# Patient Record
Sex: Female | Born: 1950
Health system: Southern US, Community
[De-identification: ages and names within clinical notes are randomized; demographics above are authoritative.]

## PROBLEM LIST (undated history)

## (undated) DIAGNOSIS — Z923 Personal history of irradiation: Secondary | ICD-10-CM

## (undated) DIAGNOSIS — I10 Essential (primary) hypertension: Secondary | ICD-10-CM

## (undated) DIAGNOSIS — D0501 Lobular carcinoma in situ of right breast: Secondary | ICD-10-CM

## (undated) DIAGNOSIS — E785 Hyperlipidemia, unspecified: Secondary | ICD-10-CM

## (undated) DIAGNOSIS — I872 Venous insufficiency (chronic) (peripheral): Secondary | ICD-10-CM

## (undated) DIAGNOSIS — E119 Type 2 diabetes mellitus without complications: Secondary | ICD-10-CM

## (undated) DIAGNOSIS — E669 Obesity, unspecified: Secondary | ICD-10-CM

## (undated) HISTORY — PX: CHOLECYSTECTOMY: SHX55

## (undated) HISTORY — DX: Hyperlipidemia, unspecified: E78.5

## (undated) HISTORY — DX: Essential (primary) hypertension: I10

## (undated) HISTORY — DX: Obesity, unspecified: E66.9

## (undated) HISTORY — PX: ABDOMINAL HYSTERECTOMY: SHX81

## (undated) HISTORY — DX: Venous insufficiency (chronic) (peripheral): I87.2

---
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
## 2001-05-16 ENCOUNTER — Emergency Department (HOSPITAL_COMMUNITY): Admission: EM | Admit: 2001-05-16 | Discharge: 2001-05-17 | Payer: Self-pay | Admitting: Emergency Medicine

## 2002-01-14 ENCOUNTER — Encounter: Payer: Self-pay | Admitting: Family Medicine

## 2002-01-14 ENCOUNTER — Ambulatory Visit (HOSPITAL_COMMUNITY): Admission: RE | Admit: 2002-01-14 | Discharge: 2002-01-14 | Payer: Self-pay | Admitting: Family Medicine

## 2002-01-16 ENCOUNTER — Ambulatory Visit (HOSPITAL_COMMUNITY): Admission: RE | Admit: 2002-01-16 | Discharge: 2002-01-16 | Payer: Self-pay | Admitting: Family Medicine

## 2002-01-17 ENCOUNTER — Encounter: Payer: Self-pay | Admitting: Family Medicine

## 2002-01-23 ENCOUNTER — Ambulatory Visit (HOSPITAL_COMMUNITY): Admission: RE | Admit: 2002-01-23 | Discharge: 2002-01-23 | Payer: Self-pay | Admitting: Family Medicine

## 2002-01-23 ENCOUNTER — Encounter: Payer: Self-pay | Admitting: Family Medicine

## 2002-02-11 ENCOUNTER — Encounter: Payer: Self-pay | Admitting: Family Medicine

## 2002-02-11 ENCOUNTER — Ambulatory Visit (HOSPITAL_COMMUNITY): Admission: RE | Admit: 2002-02-11 | Discharge: 2002-02-11 | Payer: Self-pay | Admitting: Family Medicine

## 2003-01-19 ENCOUNTER — Ambulatory Visit (HOSPITAL_COMMUNITY): Admission: RE | Admit: 2003-01-19 | Discharge: 2003-01-19 | Payer: Self-pay | Admitting: Family Medicine

## 2003-01-19 ENCOUNTER — Encounter: Payer: Self-pay | Admitting: Family Medicine

## 2003-03-31 ENCOUNTER — Ambulatory Visit (HOSPITAL_COMMUNITY): Admission: RE | Admit: 2003-03-31 | Discharge: 2003-03-31 | Payer: Self-pay | Admitting: Family Medicine

## 2003-03-31 ENCOUNTER — Encounter: Payer: Self-pay | Admitting: Family Medicine

## 2003-10-01 ENCOUNTER — Ambulatory Visit (HOSPITAL_COMMUNITY): Admission: RE | Admit: 2003-10-01 | Discharge: 2003-10-01 | Payer: Self-pay | Admitting: Family Medicine

## 2003-11-03 ENCOUNTER — Encounter (HOSPITAL_COMMUNITY): Admission: RE | Admit: 2003-11-03 | Discharge: 2003-11-04 | Payer: Self-pay | Admitting: Family Medicine

## 2003-11-27 ENCOUNTER — Encounter (HOSPITAL_COMMUNITY): Admission: RE | Admit: 2003-11-27 | Discharge: 2003-12-27 | Payer: Self-pay | Admitting: Family Medicine

## 2004-03-04 ENCOUNTER — Inpatient Hospital Stay (HOSPITAL_COMMUNITY): Admission: AD | Admit: 2004-03-04 | Discharge: 2004-03-10 | Payer: Self-pay | Admitting: Family Medicine

## 2004-06-30 ENCOUNTER — Ambulatory Visit (HOSPITAL_COMMUNITY): Admission: RE | Admit: 2004-06-30 | Discharge: 2004-06-30 | Payer: Self-pay | Admitting: Family Medicine

## 2004-07-26 ENCOUNTER — Ambulatory Visit (HOSPITAL_COMMUNITY): Payer: Self-pay | Admitting: Oncology

## 2004-07-26 ENCOUNTER — Encounter (HOSPITAL_COMMUNITY): Admission: RE | Admit: 2004-07-26 | Discharge: 2004-08-19 | Payer: Self-pay | Admitting: Oncology

## 2004-07-26 ENCOUNTER — Encounter: Admission: RE | Admit: 2004-07-26 | Discharge: 2004-08-19 | Payer: Self-pay | Admitting: Oncology

## 2004-08-19 ENCOUNTER — Ambulatory Visit (HOSPITAL_COMMUNITY): Admission: RE | Admit: 2004-08-19 | Discharge: 2004-08-19 | Payer: Self-pay | Admitting: General Surgery

## 2005-05-30 ENCOUNTER — Ambulatory Visit: Payer: Self-pay | Admitting: *Deleted

## 2005-06-07 ENCOUNTER — Encounter (HOSPITAL_COMMUNITY): Admission: RE | Admit: 2005-06-07 | Discharge: 2005-07-07 | Payer: Self-pay | Admitting: Cardiology

## 2005-06-07 ENCOUNTER — Ambulatory Visit: Payer: Self-pay | Admitting: Cardiology

## 2005-06-22 ENCOUNTER — Ambulatory Visit (HOSPITAL_COMMUNITY): Admission: RE | Admit: 2005-06-22 | Discharge: 2005-06-22 | Payer: Self-pay | Admitting: General Surgery

## 2005-07-27 ENCOUNTER — Ambulatory Visit (HOSPITAL_COMMUNITY): Admission: RE | Admit: 2005-07-27 | Discharge: 2005-07-27 | Payer: Self-pay | Admitting: Family Medicine

## 2007-03-08 ENCOUNTER — Ambulatory Visit (HOSPITAL_COMMUNITY): Admission: RE | Admit: 2007-03-08 | Discharge: 2007-03-08 | Payer: Self-pay | Admitting: Family Medicine

## 2007-03-27 ENCOUNTER — Ambulatory Visit (HOSPITAL_COMMUNITY): Admission: RE | Admit: 2007-03-27 | Discharge: 2007-03-27 | Payer: Self-pay | Admitting: Family Medicine

## 2007-06-01 ENCOUNTER — Encounter: Payer: Self-pay | Admitting: Cardiology

## 2007-07-17 ENCOUNTER — Ambulatory Visit: Payer: Self-pay | Admitting: Cardiology

## 2008-02-12 ENCOUNTER — Ambulatory Visit (HOSPITAL_COMMUNITY): Admission: RE | Admit: 2008-02-12 | Discharge: 2008-02-12 | Payer: Self-pay | Admitting: Family Medicine

## 2008-02-15 ENCOUNTER — Ambulatory Visit (HOSPITAL_COMMUNITY): Admission: RE | Admit: 2008-02-15 | Discharge: 2008-02-15 | Payer: Self-pay | Admitting: Family Medicine

## 2009-02-08 DIAGNOSIS — I10 Essential (primary) hypertension: Secondary | ICD-10-CM | POA: Insufficient documentation

## 2009-02-08 DIAGNOSIS — R609 Edema, unspecified: Secondary | ICD-10-CM

## 2009-02-08 DIAGNOSIS — E785 Hyperlipidemia, unspecified: Secondary | ICD-10-CM

## 2009-02-08 DIAGNOSIS — R079 Chest pain, unspecified: Secondary | ICD-10-CM

## 2009-02-08 DIAGNOSIS — I872 Venous insufficiency (chronic) (peripheral): Secondary | ICD-10-CM | POA: Insufficient documentation

## 2009-04-13 ENCOUNTER — Encounter: Admission: RE | Admit: 2009-04-13 | Discharge: 2009-04-13 | Payer: Self-pay | Admitting: Family Medicine

## 2010-05-31 ENCOUNTER — Ambulatory Visit (HOSPITAL_COMMUNITY): Admission: RE | Admit: 2010-05-31 | Discharge: 2010-05-31 | Payer: Self-pay | Admitting: Family Medicine

## 2010-07-13 ENCOUNTER — Other Ambulatory Visit: Admission: RE | Admit: 2010-07-13 | Discharge: 2010-07-13 | Payer: Self-pay | Admitting: Family Medicine

## 2010-08-09 ENCOUNTER — Encounter
Admission: RE | Admit: 2010-08-09 | Discharge: 2010-08-09 | Payer: Self-pay | Source: Home / Self Care | Attending: Family Medicine | Admitting: Family Medicine

## 2010-09-10 ENCOUNTER — Encounter: Payer: Self-pay | Admitting: Cardiology

## 2010-09-11 ENCOUNTER — Encounter (HOSPITAL_COMMUNITY): Payer: Self-pay | Admitting: Oncology

## 2011-01-03 NOTE — Letter (Signed)
July 17, 2007    [name]. Loleta Chance, MD  [address]  [place], [place]  [address]   RE:  LEEAN, AMEZCUA  MRN:  [id number]  /  DOB:  [birth year]   Dear Earvin Hansen:   It is my pleasure seeing Ms. Bolla again in consultation for chest  pain.  As you know, I evaluated this nice woman in 2006.  She underwent  a stress nuclear study at that time which was negative.  We tried on a  number of occasions to see her following that test, but she was unable  to keep any of those appointments.  She had done well until a few weeks  ago when she experienced 2 or 3 days of left chest discomfort.  There  was chest wall tenderness both by her report and your exam.  There was  question of some sort of heart rhythm irregularity, but this is not  documented.  She now returns for reassessment.   She has been found to have moderate hyperlipidemia and was taking a  statin for a week.  She had a subsequent cholesterol determination at  work that was good and discontinued that medication.  At the present  time, she feels perfectly well, her only other medicine is  triamterine/HCTZ 50/25 mg daily.  She has chronic pedal edema, but this  has been generally well-controlled.  She wears compression stockings  occasionally.   EXAMINATION:  Pleasant woman in no acute distress.  The weight is 260,  eight pounds less than at her last visit.  Blood pressure 125/80, heart  rate 68 and regular, respirations 18.  NECK:  No jugular venous distention; normal carotid upstrokes without  bruits.  ENDOCRINE:  No thyromegaly.  SKIN:  No significant lesions.  LUNGS:  Clear.  CARDIAC:  Normal first and second heart sounds; fourth heart sound  present.  ABDOMEN:  Soft and nontender; no organomegaly.  EXTREMITIES:  With 1+ pitting edema with some woody edema as well;  normal distal pulses.   Rhythm strip:  Normal sinus rhythm; moderately frequent monomorphic  PVCs.   IMPRESSION:  Ms. Giraldo has recurrent  atypical chest discomfort with a  single brief episode, chest wall tightness and the current absence of  symptoms, I would not perform any further testing.  She also has  asymptomatic premature ventricular contractions.  Likewise, these do not  require any specific therapy.  I explained to her that her statin, which  turns out to be lovastatin on the basis of our pill finder, is only  effective while it is actually being taken.  She will resume this  medication and return to you for further followup.  She will continue  her efforts to lose weight.  I have suggested she might want to consult  one of the vein specialists in [facility] for assessment of her chronic  pedal edema.  Please send her back to me at any time that I can offer  assistance in her care.    Sincerely,      Gerrit Friends. Dietrich Pates, MD, [facility]  Electronically Signed    RMR/MedQ  DD: 07/17/2007  DT: 07/17/2007  Job #: [id number]

## 2011-01-06 NOTE — H&P (Signed)
NAME:  [name], [name]                       ACCOUNT NO.:  [id number]   MEDICAL RECORD NO.:  [id number]                   PATIENT TYPE:  INP   LOCATION:  A322                                 FACILITY:  [facility]   PHYSICIAN:  Annia Friendly. Loleta Chance, M.D.                DATE OF BIRTH:  June 11, 1951   DATE OF ADMISSION:  03/04/2004  DATE OF DISCHARGE:                                HISTORY & PHYSICAL   The patient was a 60 year old, married, gravida 3, para 3, AB 0, employee of  [employer], black female from [place], [place].   CHIEF COMPLAINT:  Nausea, vomiting, stomach pain, watery stool less than 24  hours.   The patient ate liver with gravy and biscuit, on March 03, 2004, at a [employer] around 12:30 p.m.  The patient incurred first episode of nausea,  vomiting, and watery stools around 2200 on March 03, 2004.  The vomiting was  described as brownish color.  History is also positive for general malaise  and crampy lower abdominal pain throughout the night.  The patient vomited  on the morning of admission and she incurred four watery brown stools on the  morning of admission.  She has also vomited x 1 since admission to [facility] and experienced a small watery brown stool.  The patient  admits to experiencing sweaty spells throughout the night.  Also, she  incurred right leg pain involving the calf and right thigh, starting around  1300 on March 04, 2004.  She denied discoloration, hotness, and lesion on the  right leg.  History is also negative for trauma to the right leg.   MEDICAL HISTORY:  1. Chronic bilateral venous insufficiency.  2. Hypertension.  3. 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  Medical history is negative for diabetes, tuberculosis, cancer, sickle cell,  asthma, seizure disorder.   PRESCRIBED MEDICATIONS:  1. Aldactone 25/25 p.o. every day.  2. Flexeril  5 mg p.o. t.i.d.  3. Reprexain 5/200 one tablet p.o. q.4-6h. p.r.n. for pain.   HABITS:  Negative for ethanol, tobacco, or street drugs.   The patient is not allergic to any known medications.   Sexually transmitted disease history is negative for gonorrhea, syphilis,  herpes, and HIV infection.   PAST MEDICAL HISTORY:  Positive for hospitalizations for:  1. Cholecystectomy in 1990s at [facility].  2. Hysterectomy at [facility] secondary to fibroids.  3. Pregnancy.  4. Abdominal pain, in the 1990s at [facility], secondary to     spasmodic colon.  5. Hospitalization for cellulitis of the right leg by Dr. Theresia Majors. Tanda Rockers     at [facility] in 1998.   FAMILY HISTORY:  Revealed mother living in her 51s with a history of  hypertension, diabetes.  Father deceased at age  86 secondary to prostate  cancer.  One sister deceased in her 61s secondary to complications of  multiple sclerosis.  One sister living age 26 with a history of  hypertension.  Two brothers living, age 49 and 59, good health.  One son  living age 81 good health.  Two daughters living age 51 and 109, good health.   REVIEW OF SYSTEMS:  Positive chronic massive swelling of both legs (right  greater than left).  Review of systems negative for sore throat, epistaxis,  chronic cough, shortness of breath, syncope, dizziness, dysuria, gross  hematuria, leg ulcers, melena, constipation, hematemesis, and chest pain.   PHYSICAL EXAMINATION:  GENERAL APPEARANCE:  Revealed a middle-aged,  overweight, medium height, alert, black female who appeared not to feel  well.  VITAL SIGNS:  Temperature 103.2, pulse 111, respirations 28, blood pressure  185/80.  HEAD:  Normocephalic.  SKIN:  Hot and dry.  EARS:  Normal auricle.  External canal patent.  Tympanic membranes pearly  gray.  EYES:  Lids negative for ptosis.  Sclerae are white.  Pupils round, equally  reactive to light.  Extraocular movements  intact.  NOSE:  Negative for discharge.  MOUTH:  Positive missing teeth, remaining dentition fair.  No bleeding gums.  THROAT:  Posterior pharynx positive bilaterally enlarged tonsils without  erythema or exudate.  NECK:  Negative lymphadenopathy or thyromegaly.  Supraclavicular space with  no palpable nodes.  LUNGS:  Clear.  HEART:  Audible S1 S2 without murmur.  Rate 108 and regular.  BREASTS:  No skin changes.  Nipple erect.  ABDOMEN:  Obese.  Positive for old healed mid hyper-gastric surgical scar.  Hyperactive bowel sounds.  Soft.  Positive mild right lower quadrant and  left lower quadrant tenderness.  No palpable mass.  No organomegaly.  PELVIC:  External genitalia normal female.  Right groin no palpable nodes.  Nontender on palpation.  RECTAL:  Deferred.  EXTREMITIES:  Positive for swelling of both tibia (right greater than left)  including right tibia positive for redness of distal tibia and right foot.  Right tibia, positive diffuse tenderness on palpation.  Right thigh,  positive tenderness involving the medial aspect along medial line.  NEURO:  Alert and oriented to person, place and time.  Cranial nerves II-XII  appeared intact.   LABS:  White count 13.6, hemoglobin 12.4, hematocrit 36.1, platelets  183,000.  Prothrombin time 13.6, INR 1.1, partial thromboplastin time 27.  Sodium 127, potassium 3.4, chloride 95, CO2 24, glucose 156, BUN 13,  creatinine 0.9.   IMPRESSION:  1. Primary acute gastroenteritis.  2. Electrolyte imbalance secondary to number one.  3. Chronic venous insufficiency with her right lower extremity cellulitis.   SECONDARY DIAGNOSES:  1. Moderate left median neuropathy at the wrist.  2. Moderate right median neuropathy at the wrist.  3. Left ulnar neuropathy.  4. Hypertension.   PLAN:  1. IV fluids.  2. Blood cultures.  3. IV antibiotics Ancef, gentamicin IV x 1.  4. P.o. Levaquin.  5. Venous doppler study.  6. Lovenox subcu. 7. Elevation  of legs.  8. Pepcid 20 mg p.o. q.12h., Tylenol 650 mg p.o. q.4-6h. p.r.n. for     temperature 101 or greater, hydrocodone 5 mg p.o. q.4-6h. p.r.n. for     pain, Motrin 800 mg p.o. t.i.d. with meals,  Aldactazide 25/25 one tablet     p.o. every day, potassium supplement 20 mEq p.o. b.i.d. x 2 days.  9. A 4-gram sodium diet.  10.  Repeat MET-7 early morning x 3 and repeat CBC in 24 hours.  11.      X-ray of her abdomen.     ___________________________________________                                         Annia Friendly. Loleta Chance, M.D.   Levonne Hubert  D:  03/04/2004  T:  03/05/2004  Job:  [id number]

## 2011-01-06 NOTE — H&P (Signed)
[name], [name]              ACCOUNT NO.:  [id number]   MEDICAL RECORD NO.:  [id number]          PATIENT TYPE:  AMB   LOCATION:  DAY                           FACILITY:  [facility]   PHYSICIAN:  [name], M.D.   DATE OF BIRTH:  06/01/1951   DATE OF ADMISSION:  DATE OF DISCHARGE:  LH                                HISTORY & PHYSICAL   A 60 year old female referred for upper endoscopy.  The patient has had  episodic chest pain with mid sternal aching which has been relieved with  PPIs.  She has had recurrent fullness in her chest.  Cardiac evaluation was  negative.  Cardiology felt that her symptoms were gastroesophageal in origin  and felt that she needed to have endoscopy to rule out esophageal or gastric  disorder.   PAST HISTORY:  1.  Hypertension.  2.  Atypical chest pain.  3.  Chronic anxiety disorder.  4.  Bilateral ulnar neuropathy at the wrists.  5.  Obesity.   SURGERY:  1.  Cholecystectomy.  2.  Hysterectomy.   FAMILY HISTORY:  Positive for hypertension, diabetes, multiple sclerosis.   MEDICATIONS:  1.  Omeprazole 20 mg daily.  2.  Allegra 180 mg daily.  3.  __________  10 mg daily.  4.  Dyazide 50/12.5 daily.   PHYSICAL EXAMINATION:  GENERAL:  She is a moderately obese female in no  acute distress.  VITAL SIGNS:  Blood pressure 160/82, pulse 96, respirations 20, weight 256  pounds.  HEENT:  Unremarkable.  NECK:  Supple.  No JVD, bruit, adenopathy, or thyromegaly.  CHEST:  Clear to auscultation.  HEART:  Regular rate and rhythm without murmur, gallop, or rub.  ABDOMEN:  Mid epigastric tenderness.  No masses.  Normal bowel sounds.  EXTREMITIES:  Bilateral pitting edema, full pulses.  NEUROLOGIC:  No focal motor, sensory, or cerebellar deficit.   IMPRESSION:  1.  Recurrent atypical chest pain, symptoms of gastroesophageal reflux,      possible esophageal spasm.  2.  Hypertension.  3.  Chronic venous insufficiency.  4.  Bilateral carpal tunnel  compression.   PLAN:  Upper endoscopy.      Dirk Dress. Katrinka Romero, M.D.  Electronically Signed     LCS/MEDQ  D:  06/21/2005  T:  06/21/2005  Job:  [id number]

## 2011-01-06 NOTE — Discharge Summary (Signed)
NAME:  [name], [name]                       ACCOUNT NO.:  [id number]   MEDICAL RECORD NO.:  [id number]                   PATIENT TYPE:  INP   LOCATION:  A322                                 FACILITY:  [facility]   PHYSICIAN:  Annia Friendly. Loleta Chance, M.D.                DATE OF BIRTH:  [birth year]   DATE OF ADMISSION:  03/04/2004  DATE OF DISCHARGE:  03/10/2004                                 DISCHARGE SUMMARY   HISTORY:  The patient was a 60 year old married gravida 3, para 3, AB-0  employee of [employer].  A black female from [place], [place].  Chief complaint was nausea, vomiting, stomach pain, and watery stool less  than 24 hours.  The patient ate liver with gravy and biscuit on March 03, 2004, at a [employer] at around 12:30 p.m.  The patient incurred her  first episode of nausea, vomiting, watery stool around 2200 on March 03, 2004.  Vomitus which is described as brownish in color.  History was  positive for general malaise and crampy lower-abdominal pain throughout the  night.  The patient vomited on the morning of admission, and she incurred  four watery stools on the morning of admission.  The patient vomited x1 at  the time of admission to [facility], and incurred again a small,  watery brown stool.  The patient admitted to experiencing a sweaty spell  throughout the night.  She also incurred right leg pain involving the calf  and right thigh starting around 1300 on March 04, 2004.  She denied  discoloration, hotness and lesion on her right leg.  Moreover, history was  negative for trauma to the right leg.  History was significant for chronic  swelling of both legs (right greater than left).   PAST MEDICAL HISTORY:  Positive for chronic bilateral venous insufficiency,  hypertension, moderate left median neuropathy at the wrist and left ulnar  neuropathy.   HABITS:  Negative for tobacco, ethanol and street drugs.  The patient was  not allergic to any known  medications.   PAST MEDICAL HISTORY:  1. Positive for hospitalization with cholecystectomy in the 1990's at [facility].  2. Hysterectomy at [facility] secondary to fibroids.  3. Pregnancy.  4. Abdominal pain in 1990 at [facility] secondary to spasmodic     colon, and hospitalization for cellulitis of right leg by Dr. Theresia Majors.     Tanda Rockers at [facility] in 1998.   FAMILY HISTORY:  Mother living in her 24's with a history of hypertension  and diabetes.  Father is deceased at age 29 secondary to prostate cancer.  One sister is deceased in her 3's secondary to complications of multiple  sclerosis.  One sister is living at age 54 with a history of hypertension.  Two brothers are living at age 57 and 32 in good health.  One son living at  age 66 in good health.  Two daughters living at age 21 and 86 in good  health.   PROBLEMS:  PROBLEM #1:  Acute gastroenteritis.   PHYSICAL EXAMINATION:  General appearance is middle aged, overweight, medium  height, alert, black female who appeared not to feel well.  Vitals on  admission were as follows:  Temperature 103.2, pulse 111, respirations 28,  blood pressure 185/80.  Sclerae were white.  Lungs were clear.  Heart  revealed an audible S1 and S2 without murmur.  Rate was 108, and rhythm was  regular.  Abdomen was obese and positive for old healed hypogastric surgical  scar.  Abdominal exam demonstrated bowel sounds.  Abdomen was soft, and  positive for right lower quadrant and left lower-quadrant tenderness on  palpation.  Abdominal exam demonstrated no palpable masses or organomegaly.  The patient was neurologically intact.   LABORATORY DATA:  Significant labs on admission were as follows:  White  count 13.6, hemoglobin 12.4, hematocrit 36.1, platelets 183,000.  Sodium  127, potassium 3.4, chloride 95.  CO2 24.  Glucose 156.  BUN 13 and  creatinine 0.9.   HOSPITAL COURSE:  The patient was treated with IV  fluids using normal saline  at 50 cc per hour, Pepcid AC 20 mg p.o. q.12h.  Blood cultures x2 at 15  minutes apart, stool for ova parasites and leukocytes, stool culture.  Bedside commode, serum amylase and lipase, analgesic for pain, x-ray of the  abdomen, IV antibiotics and other supportive measures.  The patient  responded to therapy.  Repeat white count of March 06, 2003, was 11,800.  Repeat white count of March 09, 2004, was 8.6.  The patient did not incur any  significant diarrhea during this hospitalization.  She was tolerating a  regular diet without abdominal pain, nausea, vomiting or diarrhea.  The  patient was discharged to her home on March 11, 2003.  Blood culture was  negative for growth.  Urine culture was negative for growth.  The patient  was alert and oriented to person, place and time.  Erythrocyte sedimentation  rate was 32 (normal is 0 to 25).  Repeat electrolytes on March 05, 2004, were  as follows:  Sodium 138, potassium 4.1, chloride 103, CO2 29.  Serum lipase  was __________ .   PROBLEM #2:  Acute right lower-extremity cellulitis.  Examination of the  right leg demonstrated the presence of chronic swelling, but tenderness and  confluent mild redness with hotness involving the tibia diffusely.  The leg  was tender diffusely on palpation.  The patient was started on Ancef 1 gram  IV q.8h., Levaquin 750 mg p.o. daily, Motrin 800 mg p.o. t.i.d. with meals,  Flexeril 5 mg p.o. b.i.d..  The patient was also treated with bed rest,  Lovenox under the direction of pharmacy.  Prothrombin time on admission was  13.69 and INR was 1.7, and partial thromboplastin time was 27.  Venous  Doppler study on March 05, 2004, demonstrated no evidence of deep venous  thrombosis in the right lower extremity as read by Dr. Gracelyn Nurse.  The  patient experienced diffuse swelling of both lower extremities.  She still  had some mild tenderness of her right tibia at the time of discharge.   She experienced complete resolution of redness and hotness of right tibia.  She  had no tenderness of her right extremity above the knee.  She was not  complaining of shortness of breath or  chest pain.   PROBLEM #3:  Blood pressure on admission was 185/80.  Lungs were clear.  Heart exam revealed audible S1 and S2 without murmur.  Rhythm was regular,  and rate was within normal limits.  The patient was treated with sodium  restriction and Aldactazide 25/25, one tablet p.o. every day.  Blood  pressure was brought under control at the time of discharge.  Blood pressure  on the morning of discharge was 110/66 with respirations of 20.  Pulses were  87.  Lungs were clear.  Heart exam was within normal limits.   PROBLEM #4:  Obesity.  The patient was encouraged to lose weight.   PROBLEM #5.  Chronic venous insufficiency of both lower extremities.  Elevation of her feet as much as possible was mentioned to the patient.  She  was treated with Lovenox prophylactically to reduce the incidence of deep  venous thrombosis.   DISCHARGE INSTRUCTIONS:  1. Discussed at the time of discharge, diet with low salt.  2. Activity:  Bathroom privileges, feet up.   DISCHARGE MEDICATIONS:  1. Ibuprofen 800 mg, one tablet three times a day with meals.  2. Flexeril 5 mg, one tablet twice a day.  3. Aldactazide 25/ 25 one tablet p.o. every day.  4. Pepcid 20 mg, one tablet p.o. every 12 hours.  5. Keflex 500 mg, one tablet four times a day.  6. Hydrocodone APAP, 5/500, one tablet p.o. every four to six hours as     needed for pain.   FOLLOWUP:  With Dr. Loleta Chance x1 week.   FINAL PRIMARY DIAGNOSES:  1. Acute gastroenteritis.  2. Electrolyte imbalance secondary to gastroenteritis.   SECONDARY DIAGNOSES:  1. Acute right tibia cellulitis.  2. Hypertension.  3. Chronic bilateral lower-extremity venous insufficiency.  4. Moderate left median neuropathy at the left wrist.  5. Moderate right median neuropathy at  the right wrist.  6. Left ulnar neuropathy.     ___________________________________________                                         Annia Friendly. Loleta Chance, M.D.   Levonne Hubert  D:  03/10/2004  T:  03/10/2004  Job:  [id number]

## 2012-02-29 ENCOUNTER — Other Ambulatory Visit (HOSPITAL_COMMUNITY): Payer: Self-pay | Admitting: Family Medicine

## 2012-02-29 DIAGNOSIS — Z139 Encounter for screening, unspecified: Secondary | ICD-10-CM

## 2012-03-05 ENCOUNTER — Inpatient Hospital Stay (HOSPITAL_COMMUNITY): Admission: RE | Admit: 2012-03-05 | Payer: Self-pay | Source: Ambulatory Visit

## 2012-03-12 ENCOUNTER — Ambulatory Visit (HOSPITAL_COMMUNITY)
Admission: RE | Admit: 2012-03-12 | Discharge: 2012-03-12 | Disposition: A | Discharge status: Home / Self Care | Payer: Managed Care, Other (non HMO) | Source: Ambulatory Visit | Attending: Family Medicine | Admitting: Family Medicine

## 2012-03-12 DIAGNOSIS — Z139 Encounter for screening, unspecified: Secondary | ICD-10-CM

## 2012-03-12 DIAGNOSIS — Z1231 Encounter for screening mammogram for malignant neoplasm of breast: Secondary | ICD-10-CM | POA: Insufficient documentation

## 2013-01-22 ENCOUNTER — Ambulatory Visit
Admission: RE | Admit: 2013-01-22 | Discharge: 2013-01-22 | Disposition: A | Discharge status: Home / Self Care | Payer: Managed Care, Other (non HMO) | Source: Ambulatory Visit | Attending: Family Medicine | Admitting: Family Medicine

## 2013-01-22 ENCOUNTER — Other Ambulatory Visit: Payer: Self-pay | Admitting: Family Medicine

## 2013-01-22 DIAGNOSIS — R52 Pain, unspecified: Secondary | ICD-10-CM

## 2013-01-22 DIAGNOSIS — R609 Edema, unspecified: Secondary | ICD-10-CM

## 2013-02-10 ENCOUNTER — Other Ambulatory Visit: Payer: Self-pay | Admitting: *Deleted

## 2013-02-10 ENCOUNTER — Encounter: Payer: Self-pay | Admitting: Vascular Surgery

## 2013-02-10 DIAGNOSIS — I872 Venous insufficiency (chronic) (peripheral): Secondary | ICD-10-CM

## 2013-03-27 ENCOUNTER — Encounter: Payer: Self-pay | Admitting: Vascular Surgery

## 2013-03-28 ENCOUNTER — Encounter: Payer: Self-pay | Admitting: Vascular Surgery

## 2013-03-28 ENCOUNTER — Ambulatory Visit (INDEPENDENT_AMBULATORY_CARE_PROVIDER_SITE_OTHER): Payer: Managed Care, Other (non HMO) | Admitting: Vascular Surgery

## 2013-03-28 ENCOUNTER — Encounter (INDEPENDENT_AMBULATORY_CARE_PROVIDER_SITE_OTHER): Payer: Managed Care, Other (non HMO) | Admitting: *Deleted

## 2013-03-28 VITALS — BP 135/73 | HR 70 | Ht 64.5 in | Wt 253.8 lb

## 2013-03-28 DIAGNOSIS — I872 Venous insufficiency (chronic) (peripheral): Secondary | ICD-10-CM

## 2013-03-28 DIAGNOSIS — I890 Lymphedema, not elsewhere classified: Secondary | ICD-10-CM

## 2013-03-28 DIAGNOSIS — I83893 Varicose veins of bilateral lower extremities with other complications: Secondary | ICD-10-CM

## 2013-03-28 NOTE — Progress Notes (Signed)
[facility] [place]  Referred by:  [name], MD [address] [place], [place] [address]  Reason for referral: Swollen R > L leg  History of Present Illness  [name] is a 62 y.o. (Dec 30, 1950) female who presents with chief complaint: R > L swollen leg.  Patient notes, onset of swelling unknown years ago (throughout adult life), associated with sinus infections.  The patient's symptoms include: R > L leg tightness, L leg has skin changes.  Patient notes both worsen as her sinus sx worsen.  The patient has had no history of DVT, prior pregnancy, no history of varicose vein, no history of venous stasis ulcers, no known history of  Lymphedema and known history of skin changes in lower legs.  There is no family history of venous disorders.  The patient has used OTC compression stockings in the past.  Past Medical History  Diagnosis Date  . Venous insufficiency   . Stasis dermatitis   . Hypertension   . Obesity   . Hyperlipidemia   . Arthritis     Past Surgical History  Procedure Laterality Date  . Cholecystectomy      History   Social History  . Marital Status: Married    Spouse Name: N/A    Number of Children: N/A  . Years of Education: N/A   Occupational History  . Not on file.   Social History Main Topics  . Smoking status: Never Smoker   . Smokeless tobacco: Never Used  . Alcohol Use: No  . Drug Use: No  . Sexually Active: Not on file   Other Topics Concern  . Not on file   Social History Narrative  . No narrative on file    Family History  Problem Relation Age of Onset  . Diabetes Mother   . Heart disease Mother   . Hypertension Mother   . Cancer Father     Current Outpatient Prescriptions on File Prior to Visit  Medication Sig Dispense Refill  . pravastatin (PRAVACHOL) 40 MG tablet Take 40 mg by mouth daily.      . cefadroxil (DURICEF) 500 MG capsule Take 500 mg by mouth 2 (two) times daily.      . cephALEXin  (KEFLEX) 500 MG capsule Take 500 mg by mouth 4 (four) times daily.      Marland Kitchen HYDROcodone-ibuprofen (VICOPROFEN) 7.5-200 MG per tablet Take 1 tablet by mouth 2 (two) times daily.      Marland Kitchen loratadine (CLARITIN) 10 MG tablet Take 10 mg by mouth daily.      . montelukast (SINGULAIR) 10 MG tablet Take 10 mg by mouth at bedtime.      Marland Kitchen POTASSIUM CHLORIDE PO Take 10 mEq by mouth daily.      . RABEprazole (ACIPHEX) 20 MG tablet Take 20 mg by mouth daily.      Marland Kitchen spironolactone-hydrochlorothiazide (ALDACTAZIDE) 25-25 MG per tablet Take 1 tablet by mouth every 8 (eight) hours.      . traMADol (ULTRAM) 50 MG tablet Take 50 mg by mouth every 8 (eight) hours as needed for pain.       No current facility-administered medications on file prior to visit.    No Known Allergies   REVIEW OF SYSTEMS:  (Positives checked otherwise negative)  CARDIOVASCULAR:  []  chest pain, []  chest pressure, []  palpitations, []  shortness of breath when laying flat, []  shortness of breath with exertion,  [x]  pain in feet when walking, []  pain in  feet when laying flat, []  history of blood clot in veins (DVT), []  history of phlebitis, [x]  swelling in legs, []  varicose veins  PULMONARY:  []  productive cough, []  asthma, []  wheezing  NEUROLOGIC:  []  weakness in arms or legs, []  numbness in arms or legs, []  difficulty speaking or slurred speech, []  temporary loss of vision in one eye, []  dizziness  HEMATOLOGIC:  []  bleeding problems, []  problems with blood clotting too easily  MUSCULOSKEL:  []  joint pain, []  joint swelling  GASTROINTEST:  []  vomiting blood, []  blood in stool     GENITOURINARY:  []  burning with urination, []  blood in urine  PSYCHIATRIC:  []  history of major depression  INTEGUMENTARY:  []  rashes, []  ulcers  CONSTITUTIONAL:  []  fever, []  chills  Physical Examination Filed Vitals:   03/28/13 1413  BP: 135/73  Pulse: 70  Height: 5' 4.5" (1.638 m)  Weight: 253 lb 12.8 oz (115.123 kg)  SpO2: 100%   Body mass  index is 42.91 kg/(m^2).  General: A&O x 3, WDWN  Head: [place]/AT  Ear/Nose/Throat: Hearing grossly intact, nares w/o erythema or drainage, oropharynx w/o Erythema/Exudate  Eyes: PERRLA, EOMI  Neck: Supple, no nuchal rigidity, no palpable LAD  Pulmonary: Sym exp, good air movt, CTAB, no rales, rhonchi, & wheezing  Cardiac: RRR, Nl S1, S2, no Murmurs, rubs or gallops  Vascular: Vessel Right Left  Radial Palpable Palpable  Brachial  Palpable  Palpable  Carotid Palpable, without bruit Palpable, without bruit  Aorta Not palpable N/A  Femoral Palpable Palpable  Popliteal Not palpable Not palpable  PT Palpable Palpable  DP Not Palpable Not Palpable   Gastrointestinal: soft, NTND, -G/R, - HSM, - masses, - CVAT B  Musculoskeletal: M/S 5/5 throughout , Extremities without ischemic changes , B LDS, R lower leg with woody consistency, - Kaposi Stemmer sign both sides  Neurologic: Pain and light touch intact in extremities , Motor exam as listed above  Psychiatric: Judgment intact, Mood & affect appropriate for pt's clinical situation  Dermatologic: See M/S exam for extremity exam, no rashes otherwise noted  Lymph : No Cervical, Axillary, or Inguinal lymphadenopathy   Non-Invasive Vascular Imaging  BLE Venous Insufficiency Duplex (Date: 03/28/2013):   RLE: no DVT and SVT, no GSV reflux, mild CFV relux, R proximal SFV reflux, R perforator reflux  LLE: no DVT and SVT, no GSV reflux, mild CFV reflux  Outside Studies/Documentation 3 pages of outside documents were reviewed including: outpatient record.  Medical Decision Making  [name] is a 62 y.o. female who presents with: BLE chronic venous insufficiency (C2), suspect some degree of lymphedema in R leg   Based on the patient's history and examination, I recommend: compressive therapy.  I doubt any benefit to lymphoscintigraphy as the therapy is compression regardless.  I discussed with the patient the use of her 20-30  mm thigh high compression stockings.    Thank you for allowing [place] to participate in this patient's care.  Leonides Sake, MD [facility] Office: [phone] Pager: [phone]  03/28/2013, 2:43 PM

## 2013-05-06 ENCOUNTER — Other Ambulatory Visit (HOSPITAL_COMMUNITY): Payer: Self-pay | Admitting: Family Medicine

## 2013-05-06 DIAGNOSIS — Z139 Encounter for screening, unspecified: Secondary | ICD-10-CM

## 2013-05-13 ENCOUNTER — Ambulatory Visit (HOSPITAL_COMMUNITY)
Admission: RE | Admit: 2013-05-13 | Discharge: 2013-05-13 | Disposition: A | Discharge status: Home / Self Care | Payer: Managed Care, Other (non HMO) | Source: Ambulatory Visit | Attending: Family Medicine | Admitting: Family Medicine

## 2013-05-13 ENCOUNTER — Ambulatory Visit (HOSPITAL_COMMUNITY): Payer: Managed Care, Other (non HMO)

## 2013-05-13 ENCOUNTER — Other Ambulatory Visit (HOSPITAL_COMMUNITY)
Admission: RE | Admit: 2013-05-13 | Discharge: 2013-05-13 | Disposition: A | Discharge status: Home / Self Care | Payer: Managed Care, Other (non HMO) | Source: Ambulatory Visit | Attending: Family Medicine | Admitting: Family Medicine

## 2013-05-13 ENCOUNTER — Other Ambulatory Visit: Payer: Self-pay | Admitting: Family Medicine

## 2013-05-13 DIAGNOSIS — Z139 Encounter for screening, unspecified: Secondary | ICD-10-CM

## 2013-05-13 DIAGNOSIS — Z01419 Encounter for gynecological examination (general) (routine) without abnormal findings: Secondary | ICD-10-CM | POA: Insufficient documentation

## 2013-05-13 DIAGNOSIS — Z1231 Encounter for screening mammogram for malignant neoplasm of breast: Secondary | ICD-10-CM | POA: Insufficient documentation

## 2013-06-12 ENCOUNTER — Other Ambulatory Visit: Payer: Self-pay

## 2013-06-12 ENCOUNTER — Telehealth: Payer: Self-pay

## 2013-06-12 DIAGNOSIS — Z1211 Encounter for screening for malignant neoplasm of colon: Secondary | ICD-10-CM

## 2013-06-12 NOTE — Telephone Encounter (Addendum)
TAKE 1/ 2 ALDACTAZIDE ON DAY BEFORE AND DAY OF TCS.

## 2013-06-12 NOTE — Telephone Encounter (Signed)
PREPOPIK-DRINK WATER TO KEEP URINE LIGHT YELLOW.  PT SHOULD DROP OFF RX 3 DAYS PRIOR TO PROCEDURE.  

## 2013-06-12 NOTE — Telephone Encounter (Signed)
Gastroenterology Pre-Procedure Review  Request Date: 06/12/2013 Requesting Physician:Dr. [name]   PATIENT REVIEW QUESTIONS: The patient responded to the following health history questions as indicated:    1. Diabetes Melitis: no 2. Joint replacements in the past 12 months: no 3. Major health problems in the past 3 months: no 4. Has an artificial valve or MVP: no 5. Has a defibrillator: no 6. Has been advised in past to take antibiotics in advance of a procedure like teeth cleaning: no    MEDICATIONS & ALLERGIES:    Patient reports the following regarding taking any blood thinners:   Plavix? no Aspirin? YES Coumadin? no  Patient confirms/reports the following medications:  Current Outpatient Prescriptions  Medication Sig Dispense Refill  . aspirin 81 MG tablet Take 81 mg by mouth daily.      . pravastatin (PRAVACHOL) 40 MG tablet Take 40 mg by mouth daily.      Marland Kitchen spironolactone-hydrochlorothiazide (ALDACTAZIDE) 25-25 MG per tablet Take 1 tablet by mouth daily.       . traMADol (ULTRAM) 50 MG tablet Take 50 mg by mouth every 8 (eight) hours as needed for pain.       No current facility-administered medications for this visit.    Patient confirms/reports the following allergies:  No Known Allergies  No orders of the defined types were placed in this encounter.    AUTHORIZATION INFORMATION Primary Insurance:   ID #:   Group #:  Pre-Cert / Auth required Pre-Cert / Auth #:   Secondary Insurance:   ID #:   Group #:  Pre-Cert / Auth required: Pre-Cert / Auth #:   SCHEDULE INFORMATION: Procedure has been scheduled as follows:  Date: 07/02/2013           Time: 9:15 AM Location: [facility] Short Stay  This Gastroenterology Pre-Precedure Review Form is being routed to the following provider(s): Jonette Eva, MD

## 2013-06-12 NOTE — Telephone Encounter (Signed)
PT had called Darl Pikes yesterday, left message for me to return call to triage for colonoscopy. I have returned call and LMOM to call.

## 2013-06-16 MED ORDER — SOD PICOSULFATE-MAG OX-CIT ACD 10-3.5-12 MG-GM-GM PO PACK: 1.0000 | PACK | Freq: Once | ORAL | Status: DC

## 2013-06-16 NOTE — Telephone Encounter (Signed)
Rx sent to the pharmacy and instructions faxed to pt per her request to her work number/ [phone].

## 2013-06-16 NOTE — Addendum Note (Signed)
Addended by: Lavena Bullion on: 06/16/2013 11:54 AM   Modules accepted: Orders

## 2013-06-19 ENCOUNTER — Telehealth: Payer: Self-pay

## 2013-06-19 NOTE — Telephone Encounter (Signed)
I called [employer] at [phone] and spoke to [facility] who said that PA is not required for screening colonoscopy.

## 2013-07-02 ENCOUNTER — Encounter (HOSPITAL_COMMUNITY)
Admission: RE | Disposition: A | Discharge status: Home / Self Care | Payer: Self-pay | Source: Ambulatory Visit | Attending: Gastroenterology

## 2013-07-02 ENCOUNTER — Encounter (HOSPITAL_COMMUNITY): Payer: Self-pay | Admitting: *Deleted

## 2013-07-02 ENCOUNTER — Ambulatory Visit (HOSPITAL_COMMUNITY)
Admission: RE | Admit: 2013-07-02 | Discharge: 2013-07-02 | Disposition: A | Discharge status: Home / Self Care | Payer: Managed Care, Other (non HMO) | Source: Ambulatory Visit | Attending: Gastroenterology | Admitting: Gastroenterology

## 2013-07-02 DIAGNOSIS — Z1211 Encounter for screening for malignant neoplasm of colon: Secondary | ICD-10-CM | POA: Insufficient documentation

## 2013-07-02 DIAGNOSIS — D126 Benign neoplasm of colon, unspecified: Secondary | ICD-10-CM | POA: Insufficient documentation

## 2013-07-02 DIAGNOSIS — I10 Essential (primary) hypertension: Secondary | ICD-10-CM | POA: Insufficient documentation

## 2013-07-02 HISTORY — PX: COLONOSCOPY: SHX5424

## 2013-07-02 SURGERY — COLONOSCOPY
Anesthesia: Moderate Sedation

## 2013-07-02 MED ORDER — MIDAZOLAM HCL 5 MG/5ML IJ SOLN
INTRAMUSCULAR | Status: DC | PRN
  Administered 2013-07-02 (×2): 2 mg via INTRAVENOUS

## 2013-07-02 MED ORDER — MEPERIDINE HCL 100 MG/ML IJ SOLN
INTRAMUSCULAR | Status: DC | PRN
  Administered 2013-07-02 (×2): 25 mg via INTRAVENOUS

## 2013-07-02 MED ORDER — MIDAZOLAM HCL 5 MG/5ML IJ SOLN
INTRAMUSCULAR | Status: AC
  Filled 2013-07-02: qty 10

## 2013-07-02 MED ORDER — MEPERIDINE HCL 100 MG/ML IJ SOLN
INTRAMUSCULAR | Status: AC
  Filled 2013-07-02: qty 2

## 2013-07-02 MED ORDER — SODIUM CHLORIDE 0.9 % IV SOLN
INTRAVENOUS | Status: DC
  Administered 2013-07-02: 09:00:00 via INTRAVENOUS

## 2013-07-02 NOTE — Op Note (Signed)
[facility] [address] [place], [address]   COLONOSCOPY PROCEDURE REPORT  PATIENT: Sara, Romero  MR#: [id number] BIRTHDATE: [birth year] , 62  yrs. old GENDER: Female ENDOSCOPIST: Jonette Eva, MD REFERRED XB:JYNWGN Hill, M.D. PROCEDURE DATE:  07/02/2013 PROCEDURE:   Colonoscopy with cold biopsy polypectomy INDICATIONS:Average risk patient for colon cancer. MEDICATIONS: Demerol 50 mg IV and Versed 4 mg IV  DESCRIPTION OF PROCEDURE:    Physical exam was performed.  Informed consent was obtained from the patient after explaining the benefits, risks, and alternatives to procedure.  The patient was connected to monitor and placed in left lateral position. Continuous oxygen was provided by nasal cannula and IV medicine administered through an indwelling cannula.  After administration of sedation and rectal exam, the patients rectum was intubated and the EC-[id number] ([id number])  colonoscope was advanced under direct visualization to the cecum.  The scope was removed slowly by carefully examining the color, texture, anatomy, and integrity mucosa on the way out.  The patient was recovered in endoscopy and discharged home in satisfactory condition.    COLON FINDINGS: Three sessile polyps measuring 2-4 mm in size were found in the ascending colon.  A polypectomy was performed with cold forceps and The colon IS redundant.  Manual abdominal counter-pressure was used to reach the cecum.  The patient was moved on to their back to reach the cecum.  PREP QUALITY: excellent.   CECAL W/D TIME: 13 minutes COMPLICATIONS: None  ENDOSCOPIC IMPRESSION: 1.   3 COLON POLYPS REMOVED 2.   The colon IS redundant  RECOMMENDATIONS: FOLLOW A HIGH FIBER DIET.  AVOID ITEMS THAT CAUSE BLOATING. BIOPSY RESULTS SHOULD BE BACK IN 7 DAYS. Next colonoscopy in 10 years CONSIDER OVERTUBE.       _______________________________ eSignedJonette Eva, MD 07/02/2013 1:15  PM

## 2013-07-02 NOTE — H&P (Signed)
  Primary Care Physician:  Evlyn Courier, MD Primary Gastroenterologist:  Dr. Darrick Penna  Pre-Procedure History & Physical: HPI:  [name] is a 62 y.o. female here for COLON CANCER SCREENING.  Past Medical History  Diagnosis Date  . Venous insufficiency   . Stasis dermatitis   . Hypertension   . Obesity   . Hyperlipidemia     Past Surgical History  Procedure Laterality Date  . Cholecystectomy    . Abdominal hysterectomy      Prior to Admission medications   Medication Sig Start Date End Date Taking? Authorizing Provider  aspirin 81 MG tablet Take 81 mg by mouth daily.   Yes Historical Provider, MD  pravastatin (PRAVACHOL) 40 MG tablet Take 40 mg by mouth daily.   Yes Historical Provider, MD  Sod Picosulfate-Mag Ox-Cit Acd 10-3.5-12 MG-GM-GM PACK Take 1 kit by mouth once. 06/16/13  Yes West Bali, MD  spironolactone-hydrochlorothiazide (ALDACTAZIDE) 25-25 MG per tablet Take 1 tablet by mouth daily.    Yes Historical Provider, MD  traMADol (ULTRAM) 50 MG tablet Take 50 mg by mouth every 8 (eight) hours as needed for pain.   Yes Historical Provider, MD    Allergies as of 06/12/2013  . (No Known Allergies)    Family History  Problem Relation Age of Onset  . Diabetes Mother   . Heart disease Mother   . Hypertension Mother   . Cancer Father   . Colon cancer Neg Hx     History   Social History  . Marital Status: Married    Spouse Name: N/A    Number of Children: N/A  . Years of Education: N/A   Occupational History  . Not on file.   Social History Main Topics  . Smoking status: Never Smoker   . Smokeless tobacco: Never Used  . Alcohol Use: No  . Drug Use: No  . Sexual Activity: Not on file   Other Topics Concern  . Not on file   Social History Narrative  . No narrative on file    Review of Systems: See HPI, otherwise negative ROS   Physical Exam: BP 139/78  Pulse 77  Temp(Src) 97.9 F (36.6 C) (Oral)  Resp 22  Ht 5' 4.5" (1.638 m)  Wt  240 lb (108.863 kg)  BMI 40.57 kg/m2  SpO2 98% General:   Alert,  pleasant and cooperative in NAD Head:  Normocephalic and atraumatic. Neck:  Supple; Lungs:  Clear throughout to auscultation.    Heart:  Regular rate and rhythm. Abdomen:  Soft, nontender and nondistended. Normal bowel sounds, without guarding, and without rebound.   Neurologic:  Alert and  oriented x4;  grossly normal neurologically.  Impression/Plan:     SCREENING  Plan:  1. TCS TODAY

## 2013-07-07 ENCOUNTER — Encounter (HOSPITAL_COMMUNITY): Payer: Self-pay | Admitting: Gastroenterology

## 2013-07-09 ENCOUNTER — Telehealth: Payer: Self-pay

## 2013-07-09 NOTE — Telephone Encounter (Signed)
Pt called to get biopsy results from recent TCS. ( she wants me to call results to her at work at [phone] for she and her husband [place]).

## 2013-07-09 NOTE — Telephone Encounter (Signed)
Please call pt. She had simple adenomas removed from her colon. TCS in 5 years instead of 10 years. FOLLOW A High fiber diet.

## 2013-07-10 NOTE — Telephone Encounter (Signed)
Called and informed pt.  

## 2013-07-10 NOTE — Telephone Encounter (Signed)
Results Cc to PCP  

## 2014-07-13 ENCOUNTER — Other Ambulatory Visit (HOSPITAL_COMMUNITY): Payer: Self-pay | Admitting: Family Medicine

## 2014-07-13 DIAGNOSIS — Z1231 Encounter for screening mammogram for malignant neoplasm of breast: Secondary | ICD-10-CM

## 2014-07-15 ENCOUNTER — Ambulatory Visit (HOSPITAL_COMMUNITY)
Admission: RE | Admit: 2014-07-15 | Discharge: 2014-07-15 | Disposition: A | Discharge status: Home / Self Care | Payer: Managed Care, Other (non HMO) | Source: Ambulatory Visit | Attending: Family Medicine | Admitting: Family Medicine

## 2014-07-15 DIAGNOSIS — Z1231 Encounter for screening mammogram for malignant neoplasm of breast: Secondary | ICD-10-CM | POA: Insufficient documentation

## 2015-07-19 ENCOUNTER — Other Ambulatory Visit (HOSPITAL_COMMUNITY): Payer: Self-pay | Admitting: Family Medicine

## 2015-07-19 DIAGNOSIS — Z1231 Encounter for screening mammogram for malignant neoplasm of breast: Secondary | ICD-10-CM

## 2015-07-21 ENCOUNTER — Ambulatory Visit (HOSPITAL_COMMUNITY)
Admission: RE | Admit: 2015-07-21 | Discharge: 2015-07-21 | Disposition: A | Discharge status: Home / Self Care | Payer: Managed Care, Other (non HMO) | Source: Ambulatory Visit | Attending: Family Medicine | Admitting: Family Medicine

## 2015-07-21 DIAGNOSIS — Z1231 Encounter for screening mammogram for malignant neoplasm of breast: Secondary | ICD-10-CM | POA: Insufficient documentation

## 2015-09-15 ENCOUNTER — Other Ambulatory Visit (HOSPITAL_COMMUNITY): Payer: Self-pay | Admitting: Family Medicine

## 2015-09-15 ENCOUNTER — Ambulatory Visit (HOSPITAL_COMMUNITY)
Admission: RE | Admit: 2015-09-15 | Discharge: 2015-09-15 | Disposition: A | Discharge status: Home / Self Care | Payer: Managed Care, Other (non HMO) | Source: Ambulatory Visit | Attending: Family Medicine | Admitting: Family Medicine

## 2015-09-15 DIAGNOSIS — H9202 Otalgia, left ear: Secondary | ICD-10-CM

## 2015-09-15 DIAGNOSIS — R51 Headache: Secondary | ICD-10-CM | POA: Diagnosis present

## 2016-03-03 ENCOUNTER — Other Ambulatory Visit (HOSPITAL_COMMUNITY): Payer: Self-pay | Admitting: Family Medicine

## 2016-03-09 ENCOUNTER — Other Ambulatory Visit (HOSPITAL_COMMUNITY): Payer: Self-pay | Admitting: Family Medicine

## 2016-03-09 ENCOUNTER — Other Ambulatory Visit: Payer: Self-pay | Admitting: Family Medicine

## 2016-03-09 DIAGNOSIS — M79604 Pain in right leg: Secondary | ICD-10-CM

## 2016-03-09 DIAGNOSIS — R600 Localized edema: Secondary | ICD-10-CM

## 2016-03-14 ENCOUNTER — Ambulatory Visit (HOSPITAL_COMMUNITY)
Admission: RE | Admit: 2016-03-14 | Discharge: 2016-03-14 | Disposition: A | Discharge status: Home / Self Care | Payer: Managed Care, Other (non HMO) | Source: Ambulatory Visit | Attending: Family Medicine | Admitting: Family Medicine

## 2016-03-14 DIAGNOSIS — M79604 Pain in right leg: Secondary | ICD-10-CM

## 2016-03-14 DIAGNOSIS — R600 Localized edema: Secondary | ICD-10-CM | POA: Insufficient documentation

## 2016-04-04 DIAGNOSIS — I10 Essential (primary) hypertension: Secondary | ICD-10-CM | POA: Diagnosis not present

## 2016-04-04 DIAGNOSIS — E1122 Type 2 diabetes mellitus with diabetic chronic kidney disease: Secondary | ICD-10-CM | POA: Diagnosis not present

## 2016-04-04 DIAGNOSIS — E669 Obesity, unspecified: Secondary | ICD-10-CM | POA: Diagnosis not present

## 2016-04-04 DIAGNOSIS — M25561 Pain in right knee: Secondary | ICD-10-CM | POA: Diagnosis not present

## 2016-05-15 DIAGNOSIS — B029 Zoster without complications: Secondary | ICD-10-CM | POA: Diagnosis not present

## 2016-05-29 DIAGNOSIS — I10 Essential (primary) hypertension: Secondary | ICD-10-CM | POA: Diagnosis not present

## 2016-05-29 DIAGNOSIS — Z23 Encounter for immunization: Secondary | ICD-10-CM | POA: Diagnosis not present

## 2016-05-29 DIAGNOSIS — E785 Hyperlipidemia, unspecified: Secondary | ICD-10-CM | POA: Diagnosis not present

## 2016-05-29 DIAGNOSIS — B029 Zoster without complications: Secondary | ICD-10-CM | POA: Diagnosis not present

## 2016-05-29 DIAGNOSIS — N183 Chronic kidney disease, stage 3 (moderate): Secondary | ICD-10-CM | POA: Diagnosis not present

## 2016-05-29 DIAGNOSIS — E1121 Type 2 diabetes mellitus with diabetic nephropathy: Secondary | ICD-10-CM | POA: Diagnosis not present

## 2016-05-30 ENCOUNTER — Ambulatory Visit (HOSPITAL_COMMUNITY)
Admission: RE | Admit: 2016-05-30 | Discharge: 2016-05-30 | Disposition: A | Discharge status: Home / Self Care | Payer: Medicare Other | Source: Ambulatory Visit | Attending: Family Medicine | Admitting: Family Medicine

## 2016-05-30 ENCOUNTER — Other Ambulatory Visit (HOSPITAL_COMMUNITY): Payer: Self-pay | Admitting: Family Medicine

## 2016-05-30 DIAGNOSIS — M170 Bilateral primary osteoarthritis of knee: Secondary | ICD-10-CM | POA: Diagnosis not present

## 2016-05-31 DIAGNOSIS — Z23 Encounter for immunization: Secondary | ICD-10-CM | POA: Diagnosis not present

## 2016-08-28 DIAGNOSIS — I10 Essential (primary) hypertension: Secondary | ICD-10-CM | POA: Diagnosis not present

## 2016-08-28 DIAGNOSIS — E118 Type 2 diabetes mellitus with unspecified complications: Secondary | ICD-10-CM | POA: Diagnosis not present

## 2016-08-28 DIAGNOSIS — Z6838 Body mass index (BMI) 38.0-38.9, adult: Secondary | ICD-10-CM | POA: Diagnosis not present

## 2016-08-28 DIAGNOSIS — E119 Type 2 diabetes mellitus without complications: Secondary | ICD-10-CM | POA: Diagnosis not present

## 2016-08-28 DIAGNOSIS — M170 Bilateral primary osteoarthritis of knee: Secondary | ICD-10-CM | POA: Diagnosis not present

## 2016-12-26 DIAGNOSIS — E119 Type 2 diabetes mellitus without complications: Secondary | ICD-10-CM | POA: Diagnosis not present

## 2016-12-26 DIAGNOSIS — I10 Essential (primary) hypertension: Secondary | ICD-10-CM | POA: Diagnosis not present

## 2017-05-29 DIAGNOSIS — Z0000 Encounter for general adult medical examination without abnormal findings: Secondary | ICD-10-CM | POA: Diagnosis not present

## 2017-07-03 DIAGNOSIS — Z23 Encounter for immunization: Secondary | ICD-10-CM | POA: Diagnosis not present

## 2017-07-03 DIAGNOSIS — L03011 Cellulitis of right finger: Secondary | ICD-10-CM | POA: Diagnosis not present

## 2017-11-29 ENCOUNTER — Other Ambulatory Visit (HOSPITAL_COMMUNITY): Payer: Self-pay | Admitting: Family Medicine

## 2017-11-29 DIAGNOSIS — Z1231 Encounter for screening mammogram for malignant neoplasm of breast: Secondary | ICD-10-CM

## 2017-12-06 ENCOUNTER — Ambulatory Visit (HOSPITAL_COMMUNITY)
Admission: RE | Admit: 2017-12-06 | Discharge: 2017-12-06 | Disposition: A | Discharge status: Home / Self Care | Payer: Medicare Other | Source: Ambulatory Visit | Attending: Family Medicine | Admitting: Family Medicine

## 2017-12-06 ENCOUNTER — Encounter (HOSPITAL_COMMUNITY): Payer: Self-pay

## 2017-12-06 DIAGNOSIS — Z1231 Encounter for screening mammogram for malignant neoplasm of breast: Secondary | ICD-10-CM | POA: Diagnosis not present

## 2017-12-17 DIAGNOSIS — Z0000 Encounter for general adult medical examination without abnormal findings: Secondary | ICD-10-CM | POA: Diagnosis not present

## 2017-12-17 DIAGNOSIS — I10 Essential (primary) hypertension: Secondary | ICD-10-CM | POA: Diagnosis not present

## 2017-12-17 DIAGNOSIS — E119 Type 2 diabetes mellitus without complications: Secondary | ICD-10-CM | POA: Diagnosis not present

## 2018-04-16 DIAGNOSIS — I10 Essential (primary) hypertension: Secondary | ICD-10-CM | POA: Diagnosis not present

## 2018-04-16 DIAGNOSIS — E785 Hyperlipidemia, unspecified: Secondary | ICD-10-CM | POA: Diagnosis not present

## 2018-04-16 DIAGNOSIS — L659 Nonscarring hair loss, unspecified: Secondary | ICD-10-CM | POA: Diagnosis not present

## 2018-04-16 DIAGNOSIS — E118 Type 2 diabetes mellitus with unspecified complications: Secondary | ICD-10-CM | POA: Diagnosis not present

## 2018-04-16 DIAGNOSIS — R799 Abnormal finding of blood chemistry, unspecified: Secondary | ICD-10-CM | POA: Diagnosis not present

## 2018-04-16 DIAGNOSIS — R7309 Other abnormal glucose: Secondary | ICD-10-CM | POA: Diagnosis not present

## 2018-04-16 DIAGNOSIS — Z1321 Encounter for screening for nutritional disorder: Secondary | ICD-10-CM | POA: Diagnosis not present

## 2018-05-16 DIAGNOSIS — L638 Other alopecia areata: Secondary | ICD-10-CM | POA: Diagnosis not present

## 2018-05-16 DIAGNOSIS — Z79899 Other long term (current) drug therapy: Secondary | ICD-10-CM | POA: Diagnosis not present

## 2018-05-16 DIAGNOSIS — L218 Other seborrheic dermatitis: Secondary | ICD-10-CM | POA: Diagnosis not present

## 2018-05-16 DIAGNOSIS — L308 Other specified dermatitis: Secondary | ICD-10-CM | POA: Diagnosis not present

## 2018-05-28 DIAGNOSIS — Z0000 Encounter for general adult medical examination without abnormal findings: Secondary | ICD-10-CM | POA: Diagnosis not present

## 2018-07-12 DIAGNOSIS — H5211 Myopia, right eye: Secondary | ICD-10-CM | POA: Diagnosis not present

## 2018-07-12 DIAGNOSIS — H04123 Dry eye syndrome of bilateral lacrimal glands: Secondary | ICD-10-CM | POA: Diagnosis not present

## 2018-07-12 DIAGNOSIS — H5202 Hypermetropia, left eye: Secondary | ICD-10-CM | POA: Diagnosis not present

## 2018-07-12 DIAGNOSIS — H31002 Unspecified chorioretinal scars, left eye: Secondary | ICD-10-CM | POA: Diagnosis not present

## 2018-08-08 DIAGNOSIS — L218 Other seborrheic dermatitis: Secondary | ICD-10-CM | POA: Diagnosis not present

## 2018-08-08 DIAGNOSIS — L308 Other specified dermatitis: Secondary | ICD-10-CM | POA: Diagnosis not present

## 2019-01-23 ENCOUNTER — Other Ambulatory Visit (HOSPITAL_COMMUNITY): Payer: Self-pay | Admitting: Family Medicine

## 2019-01-23 DIAGNOSIS — Z1231 Encounter for screening mammogram for malignant neoplasm of breast: Secondary | ICD-10-CM

## 2019-01-30 ENCOUNTER — Other Ambulatory Visit: Payer: Self-pay

## 2019-01-30 ENCOUNTER — Ambulatory Visit (HOSPITAL_COMMUNITY)
Admission: RE | Admit: 2019-01-30 | Discharge: 2019-01-30 | Disposition: A | Discharge status: Home / Self Care | Payer: Medicare Other | Source: Ambulatory Visit | Attending: Family Medicine | Admitting: Family Medicine

## 2019-01-30 DIAGNOSIS — Z1231 Encounter for screening mammogram for malignant neoplasm of breast: Secondary | ICD-10-CM | POA: Diagnosis not present

## 2019-02-05 ENCOUNTER — Other Ambulatory Visit (HOSPITAL_COMMUNITY): Payer: Self-pay | Admitting: Family Medicine

## 2019-02-05 DIAGNOSIS — R928 Other abnormal and inconclusive findings on diagnostic imaging of breast: Secondary | ICD-10-CM

## 2019-02-11 ENCOUNTER — Ambulatory Visit (HOSPITAL_COMMUNITY): Payer: Medicare Other

## 2019-02-11 ENCOUNTER — Ambulatory Visit (HOSPITAL_COMMUNITY)
Admission: RE | Admit: 2019-02-11 | Discharge: 2019-02-11 | Disposition: A | Discharge status: Home / Self Care | Payer: Medicare Other | Source: Ambulatory Visit | Attending: Family Medicine | Admitting: Family Medicine

## 2019-02-11 ENCOUNTER — Other Ambulatory Visit: Payer: Self-pay

## 2019-02-11 DIAGNOSIS — R928 Other abnormal and inconclusive findings on diagnostic imaging of breast: Secondary | ICD-10-CM

## 2019-02-11 DIAGNOSIS — R921 Mammographic calcification found on diagnostic imaging of breast: Secondary | ICD-10-CM | POA: Diagnosis not present

## 2019-02-12 ENCOUNTER — Other Ambulatory Visit: Payer: Self-pay | Admitting: Family Medicine

## 2019-02-12 DIAGNOSIS — R921 Mammographic calcification found on diagnostic imaging of breast: Secondary | ICD-10-CM

## 2019-02-18 ENCOUNTER — Encounter (HOSPITAL_COMMUNITY): Payer: Medicare Other

## 2019-02-18 ENCOUNTER — Other Ambulatory Visit (HOSPITAL_COMMUNITY): Payer: Medicare Other

## 2019-02-19 ENCOUNTER — Ambulatory Visit
Admission: RE | Admit: 2019-02-19 | Discharge: 2019-02-19 | Disposition: A | Discharge status: Home / Self Care | Payer: Medicare Other | Source: Ambulatory Visit | Attending: Family Medicine | Admitting: Family Medicine

## 2019-02-19 ENCOUNTER — Other Ambulatory Visit: Payer: Self-pay

## 2019-02-19 DIAGNOSIS — D0501 Lobular carcinoma in situ of right breast: Secondary | ICD-10-CM | POA: Diagnosis not present

## 2019-02-19 DIAGNOSIS — R921 Mammographic calcification found on diagnostic imaging of breast: Secondary | ICD-10-CM | POA: Diagnosis not present

## 2019-02-27 ENCOUNTER — Other Ambulatory Visit: Payer: Self-pay | Admitting: General Surgery

## 2019-02-27 DIAGNOSIS — D0501 Lobular carcinoma in situ of right breast: Secondary | ICD-10-CM | POA: Diagnosis not present

## 2019-02-27 DIAGNOSIS — I10 Essential (primary) hypertension: Secondary | ICD-10-CM | POA: Diagnosis not present

## 2019-02-28 ENCOUNTER — Other Ambulatory Visit: Payer: Self-pay | Admitting: General Surgery

## 2019-02-28 DIAGNOSIS — D0501 Lobular carcinoma in situ of right breast: Secondary | ICD-10-CM

## 2019-03-10 ENCOUNTER — Other Ambulatory Visit: Payer: Self-pay

## 2019-03-10 ENCOUNTER — Encounter (HOSPITAL_BASED_OUTPATIENT_CLINIC_OR_DEPARTMENT_OTHER): Payer: Self-pay | Admitting: *Deleted

## 2019-03-14 ENCOUNTER — Encounter (HOSPITAL_BASED_OUTPATIENT_CLINIC_OR_DEPARTMENT_OTHER)
Admission: RE | Admit: 2019-03-14 | Discharge: 2019-03-14 | Disposition: A | Discharge status: Home / Self Care | Payer: Medicare Other | Source: Ambulatory Visit | Attending: General Surgery | Admitting: General Surgery

## 2019-03-14 ENCOUNTER — Other Ambulatory Visit (HOSPITAL_COMMUNITY)
Admission: RE | Admit: 2019-03-14 | Discharge: 2019-03-14 | Disposition: A | Discharge status: Home / Self Care | Payer: Medicare Other | Source: Ambulatory Visit | Attending: General Surgery | Admitting: General Surgery

## 2019-03-14 DIAGNOSIS — Z1159 Encounter for screening for other viral diseases: Secondary | ICD-10-CM | POA: Diagnosis not present

## 2019-03-14 DIAGNOSIS — C50911 Malignant neoplasm of unspecified site of right female breast: Secondary | ICD-10-CM | POA: Insufficient documentation

## 2019-03-14 DIAGNOSIS — R9431 Abnormal electrocardiogram [ECG] [EKG]: Secondary | ICD-10-CM | POA: Insufficient documentation

## 2019-03-14 DIAGNOSIS — Z01818 Encounter for other preprocedural examination: Secondary | ICD-10-CM | POA: Insufficient documentation

## 2019-03-14 LAB — CBC WITH DIFFERENTIAL/PLATELET
Abs Immature Granulocytes: 0.01 10*3/uL (ref 0.00–0.07)
Basophils Absolute: 0 10*3/uL (ref 0.0–0.1)
Basophils Relative: 1 %
Eosinophils Absolute: 0.3 10*3/uL (ref 0.0–0.5)
Eosinophils Relative: 5 %
HCT: 37.9 % (ref 36.0–46.0)
Hemoglobin: 12.3 g/dL (ref 12.0–15.0)
Immature Granulocytes: 0 %
Lymphocytes Relative: 22 %
Lymphs Abs: 1.4 10*3/uL (ref 0.7–4.0)
MCH: 29.2 pg (ref 26.0–34.0)
MCHC: 32.5 g/dL (ref 30.0–36.0)
MCV: 90 fL (ref 80.0–100.0)
Monocytes Absolute: 0.4 10*3/uL (ref 0.1–1.0)
Monocytes Relative: 7 %
Neutro Abs: 4.1 10*3/uL (ref 1.7–7.7)
Neutrophils Relative %: 65 %
Platelets: 206 10*3/uL (ref 150–400)
RBC: 4.21 MIL/uL (ref 3.87–5.11)
RDW: 13.2 % (ref 11.5–15.5)
WBC: 6.2 10*3/uL (ref 4.0–10.5)
nRBC: 0 % (ref 0.0–0.2)

## 2019-03-14 LAB — COMPREHENSIVE METABOLIC PANEL
ALT: 17 U/L (ref 0–44)
AST: 19 U/L (ref 15–41)
Albumin: 4.1 g/dL (ref 3.5–5.0)
Alkaline Phosphatase: 71 U/L (ref 38–126)
Anion gap: 12 (ref 5–15)
BUN: 23 mg/dL (ref 8–23)
CO2: 29 mmol/L (ref 22–32)
Calcium: 9.8 mg/dL (ref 8.9–10.3)
Chloride: 101 mmol/L (ref 98–111)
Creatinine, Ser: 1.34 mg/dL — ABNORMAL HIGH (ref 0.44–1.00)
GFR calc Af Amer: 47 mL/min — ABNORMAL LOW (ref >60)
GFR calc non Af Amer: 40 mL/min — ABNORMAL LOW (ref >60)
Glucose, Bld: 106 mg/dL — ABNORMAL HIGH (ref 70–99)
Potassium: 3.5 mmol/L (ref 3.5–5.1)
Sodium: 142 mmol/L (ref 135–145)
Total Bilirubin: 0.6 mg/dL (ref 0.3–1.2)
Total Protein: 7.4 g/dL (ref 6.5–8.1)

## 2019-03-14 NOTE — Progress Notes (Signed)
Pt here for EKG and blood work. G2 drink given for presurgery ERAS. Written and verbal instruction given to finish drink DOS by 0900. Hibiclens/CHG given with instruction to shower with half evening before and half morning of surgery. Pt verbalized understanding.

## 2019-03-15 LAB — SARS CORONAVIRUS 2 (TAT 6-24 HRS): SARS Coronavirus 2: NEGATIVE

## 2019-03-15 NOTE — H&P (Signed)
[name] Location: [facility] Patient #: [id number] DOB: 1951-03-31 Married / Language: English / Race: Black or African American Female         History of Present Illness        This is a 68 year old female, referred by Dr. Margarette Canada at the [facility] for multifocal lobular carcinoma in situ right breast. [name] is her PCP. [name] served as my Producer, television/film/video      Recent screening mammogram showed 2 areas of calcifications on the right. One area is lateral, 15 mm diameter a second area is right medial and inferior to the first grade, 8 mm in diameter. These are 2.5 cm apart. Because of these areas were biopsied and both showed lobular carcinoma in situ. There was no pleomorphic change.       Past history is negative for breast disease. Hyperlipidemia. Hypertension. Obesity. Chronic right lower extremity lymphedema. TAH and BSO. Midline incision. Cholecystectomy Family history negative for breast ovarian or pancreatic cancer. Father had prostate cancer but this was not aggressive. Mother had diabetes and heart disease Social history reveals she lives in [place] with her husband. They have 3 children. Denies alcohol or tobacco. 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 Her 51 year old mother lives with her.      I explained lobular carcinoma in situ to her. I told her this was not a true cancer that needed treatment but that it is a high risk finding a core biopsy and should be excised to rule out invasive cancer. She agrees. She'll be scheduled for right breast lumpectomy 2 with radioactive seed localization 2. I discussed the indications, details, techniques, and numerous risk of the surgery with her. She is aware of the risk of bleeding, infection, cosmetic deformity, chronic pain, reoperation if invasive cancer is found. She understands all these issues. All of her questions were answered. She agrees with this  plan  Hopefully we can use a single incision since the lesions are 2.5 cm apart    Allergies  No Known Drug Allergies   Allergies Reconciled   Medication History  Aspirin (81MG  Tablet, Oral) Active. Pravastatin Sodium (40MG  Tablet, Oral) Active. Spironolactone-HCTZ (25-25MG  Tablet, Oral) Active. metFORMIN HCl (500MG  Tablet, Oral) Active. Medications Reconciled  Vitals Weight: 242.8 lb Height: 65in Body Surface Area: 2.15 m Body Mass Index: 40.4 kg/m  Temp.: 98.23F  Pulse: 78 (Regular)  BP: 126/72(Sitting, Left Arm, Standard)       Physical Exam  General Mental Status-Alert. General Appearance-Consistent with stated age. Hydration-Well hydrated. Voice-Normal. Note: BMI 40.4   Head and Neck Head-normocephalic, atraumatic with no lesions or palpable masses. Trachea-midline. Thyroid Gland Characteristics - normal size and consistency.  Eye Eyeball - Bilateral-Extraocular movements intact. Sclera/Conjunctiva - Bilateral-No scleral icterus.  Chest and Lung Exam Chest and lung exam reveals -quiet, even and easy respiratory effort with no use of accessory muscles and on auscultation, normal breath sounds, no adventitious sounds and normal vocal resonance. Inspection Chest Wall - Normal. Back - normal.  Breast Note: Breasts are large. 2 biopsy sites right breast above the areolar margin. No hematoma. No mass in either breast. No axillary adenopathy.   Cardiovascular Cardiovascular examination reveals -normal heart sounds, regular rate and rhythm with no murmurs and normal pedal pulses bilaterally.  Abdomen Inspection Inspection of the abdomen reveals - No Hernias. Skin - Scar - Note: Healed midline scar from hysterectomy. Palpation/Percussion Palpation and Percussion of the abdomen reveal - Soft, Non Tender,  No Rebound tenderness, No Rigidity (guarding) and No hepatosplenomegaly. Auscultation Auscultation of the  abdomen reveals - Bowel sounds normal.  Neurologic Neurologic evaluation reveals -alert and oriented x 3 with no impairment of recent or remote memory. Mental Status-Normal.  Musculoskeletal Normal Exam - Left-Upper Extremity Strength Normal and Lower Extremity Strength Normal. Normal Exam - Right-Upper Extremity Strength Normal and Lower Extremity Strength Normal.  Lymphatic Head & Neck  General Head & Neck Lymphatics: Bilateral - Description - Normal. Axillary  General Axillary Region: Bilateral - Description - Normal. Tenderness - Non Tender. Femoral & Inguinal  Generalized Femoral & Inguinal Lymphatics: Bilateral - Description - Normal. Tenderness - Non Tender.    Assessment & Plan   LOBULAR CARCINOMA IN SITU (LCIS) OF RIGHT BREAST (D05.01)      Your recent mammogram showed 2 areas of focal calcifications on the right side Both of these areas were biopsied and both of these areas show a condition called lobular carcinoma in situ      While this is not a true cancer, there is a significant risk that you could have cancer in the area This risk is at least 10% Both of these areas should be removed and you agree      you will be scheduled for right breast lumpectomy with radioactive seed 2 Dr. Dalbert Romero has discussed the indications, techniques, and risk of the surgery with you in detail  After you recover from the surgery we will possibly do further testing to assess your lifetime risk   HISTORY OF TOTAL ABDOMINAL HYSTERECTOMY AND BILATERAL SALPINGO-OOPHORECTOMY (Z90.710)  HYPERTENSION, ESSENTIAL (I10)  BMI 40.0-44.9, ADULT (Z68.41)  LYMPHEDEMA OF RIGHT LOWER EXTREMITY (I89.0)    [name], M.D., [facility] General and Minimally invasive Surgery Breast and Colorectal Surgery Office:   [phone] Pager:   [phone]

## 2019-03-17 ENCOUNTER — Ambulatory Visit
Admission: RE | Admit: 2019-03-17 | Discharge: 2019-03-17 | Disposition: A | Discharge status: Home / Self Care | Payer: Medicare Other | Source: Ambulatory Visit | Attending: General Surgery | Admitting: General Surgery

## 2019-03-17 ENCOUNTER — Other Ambulatory Visit: Payer: Self-pay

## 2019-03-17 DIAGNOSIS — D0501 Lobular carcinoma in situ of right breast: Secondary | ICD-10-CM | POA: Diagnosis not present

## 2019-03-18 ENCOUNTER — Encounter (HOSPITAL_BASED_OUTPATIENT_CLINIC_OR_DEPARTMENT_OTHER)
Admission: RE | Disposition: A | Discharge status: Home / Self Care | Payer: Self-pay | Source: Home / Self Care | Attending: General Surgery

## 2019-03-18 ENCOUNTER — Ambulatory Visit (HOSPITAL_BASED_OUTPATIENT_CLINIC_OR_DEPARTMENT_OTHER)
Admission: RE | Admit: 2019-03-18 | Discharge: 2019-03-18 | Disposition: A | Discharge status: Home / Self Care | Payer: Medicare Other | Attending: General Surgery | Admitting: General Surgery

## 2019-03-18 ENCOUNTER — Ambulatory Visit (HOSPITAL_BASED_OUTPATIENT_CLINIC_OR_DEPARTMENT_OTHER): Payer: Medicare Other | Admitting: Anesthesiology

## 2019-03-18 ENCOUNTER — Ambulatory Visit
Admission: RE | Admit: 2019-03-18 | Discharge: 2019-03-18 | Disposition: A | Discharge status: Home / Self Care | Payer: Medicare Other | Source: Ambulatory Visit | Attending: General Surgery | Admitting: General Surgery

## 2019-03-18 ENCOUNTER — Other Ambulatory Visit: Payer: Self-pay

## 2019-03-18 ENCOUNTER — Encounter (HOSPITAL_BASED_OUTPATIENT_CLINIC_OR_DEPARTMENT_OTHER): Payer: Self-pay | Admitting: *Deleted

## 2019-03-18 DIAGNOSIS — E669 Obesity, unspecified: Secondary | ICD-10-CM | POA: Diagnosis not present

## 2019-03-18 DIAGNOSIS — D241 Benign neoplasm of right breast: Secondary | ICD-10-CM | POA: Insufficient documentation

## 2019-03-18 DIAGNOSIS — Z79899 Other long term (current) drug therapy: Secondary | ICD-10-CM | POA: Insufficient documentation

## 2019-03-18 DIAGNOSIS — E785 Hyperlipidemia, unspecified: Secondary | ICD-10-CM | POA: Insufficient documentation

## 2019-03-18 DIAGNOSIS — I10 Essential (primary) hypertension: Secondary | ICD-10-CM | POA: Insufficient documentation

## 2019-03-18 DIAGNOSIS — Z7984 Long term (current) use of oral hypoglycemic drugs: Secondary | ICD-10-CM | POA: Diagnosis not present

## 2019-03-18 DIAGNOSIS — D0501 Lobular carcinoma in situ of right breast: Secondary | ICD-10-CM | POA: Diagnosis not present

## 2019-03-18 DIAGNOSIS — Z6841 Body Mass Index (BMI) 40.0 and over, adult: Secondary | ICD-10-CM | POA: Diagnosis not present

## 2019-03-18 DIAGNOSIS — E119 Type 2 diabetes mellitus without complications: Secondary | ICD-10-CM | POA: Diagnosis not present

## 2019-03-18 DIAGNOSIS — D0511 Intraductal carcinoma in situ of right breast: Secondary | ICD-10-CM | POA: Diagnosis present

## 2019-03-18 DIAGNOSIS — I890 Lymphedema, not elsewhere classified: Secondary | ICD-10-CM | POA: Insufficient documentation

## 2019-03-18 DIAGNOSIS — Z7982 Long term (current) use of aspirin: Secondary | ICD-10-CM | POA: Insufficient documentation

## 2019-03-18 DIAGNOSIS — E782 Mixed hyperlipidemia: Secondary | ICD-10-CM | POA: Diagnosis not present

## 2019-03-18 HISTORY — PX: BREAST LUMPECTOMY: SHX2

## 2019-03-18 HISTORY — DX: Lobular carcinoma in situ of right breast: D05.01

## 2019-03-18 HISTORY — PX: BREAST LUMPECTOMY WITH RADIOACTIVE SEED LOCALIZATION: SHX6424

## 2019-03-18 HISTORY — DX: Type 2 diabetes mellitus without complications: E11.9

## 2019-03-18 LAB — GLUCOSE, CAPILLARY: Glucose-Capillary: 86 mg/dL (ref 70–99)

## 2019-03-18 SURGERY — BREAST LUMPECTOMY WITH RADIOACTIVE SEED LOCALIZATION
Anesthesia: General | Site: Breast | Laterality: Right

## 2019-03-18 MED ORDER — CELECOXIB 200 MG PO CAPS
ORAL_CAPSULE | ORAL | Status: AC
  Filled 2019-03-18: qty 1

## 2019-03-18 MED ORDER — PHENYLEPHRINE 40 MCG/ML (10ML) SYRINGE FOR IV PUSH (FOR BLOOD PRESSURE SUPPORT)
PREFILLED_SYRINGE | INTRAVENOUS | Status: DC | PRN
  Administered 2019-03-18: 120 ug via INTRAVENOUS
  Administered 2019-03-18 (×2): 80 ug via INTRAVENOUS

## 2019-03-18 MED ORDER — EPHEDRINE 5 MG/ML INJ
INTRAVENOUS | Status: AC
  Filled 2019-03-18: qty 20

## 2019-03-18 MED ORDER — SUCCINYLCHOLINE CHLORIDE 200 MG/10ML IV SOSY
PREFILLED_SYRINGE | INTRAVENOUS | Status: AC
  Filled 2019-03-18: qty 10

## 2019-03-18 MED ORDER — SODIUM CHLORIDE 0.9% FLUSH: 3.0000 mL | Freq: Two times a day (BID) | INTRAVENOUS | Status: DC

## 2019-03-18 MED ORDER — SCOPOLAMINE 1 MG/3DAYS TD PT72: 1.0000 | MEDICATED_PATCH | Freq: Once | TRANSDERMAL | Status: DC

## 2019-03-18 MED ORDER — CELECOXIB 200 MG PO CAPS
200.0000 mg | ORAL_CAPSULE | ORAL | Status: AC
  Administered 2019-03-18: 200 mg via ORAL

## 2019-03-18 MED ORDER — PROMETHAZINE HCL 25 MG/ML IJ SOLN: 6.2500 mg | INTRAMUSCULAR | Status: DC | PRN

## 2019-03-18 MED ORDER — CEFAZOLIN SODIUM-DEXTROSE 2-4 GM/100ML-% IV SOLN
2.0000 g | INTRAVENOUS | Status: AC
  Administered 2019-03-18: 2 g via INTRAVENOUS

## 2019-03-18 MED ORDER — FENTANYL CITRATE (PF) 100 MCG/2ML IJ SOLN
INTRAMUSCULAR | Status: AC
  Filled 2019-03-18: qty 2

## 2019-03-18 MED ORDER — LIDOCAINE 2% (20 MG/ML) 5 ML SYRINGE
INTRAMUSCULAR | Status: AC
  Filled 2019-03-18: qty 5

## 2019-03-18 MED ORDER — EPHEDRINE 5 MG/ML INJ
INTRAVENOUS | Status: AC
  Filled 2019-03-18: qty 10

## 2019-03-18 MED ORDER — DEXAMETHASONE SODIUM PHOSPHATE 10 MG/ML IJ SOLN
INTRAMUSCULAR | Status: AC
  Filled 2019-03-18: qty 1

## 2019-03-18 MED ORDER — HYDROCODONE-ACETAMINOPHEN 5-325 MG PO TABS: 1.0000 | ORAL_TABLET | Freq: Four times a day (QID) | ORAL | 0 refills | Status: DC | PRN

## 2019-03-18 MED ORDER — FENTANYL CITRATE (PF) 100 MCG/2ML IJ SOLN: 25.0000 ug | INTRAMUSCULAR | Status: DC | PRN

## 2019-03-18 MED ORDER — CEFAZOLIN SODIUM-DEXTROSE 2-4 GM/100ML-% IV SOLN
INTRAVENOUS | Status: AC
  Filled 2019-03-18: qty 100

## 2019-03-18 MED ORDER — PHENYLEPHRINE 40 MCG/ML (10ML) SYRINGE FOR IV PUSH (FOR BLOOD PRESSURE SUPPORT)
PREFILLED_SYRINGE | INTRAVENOUS | Status: AC
  Filled 2019-03-18: qty 10

## 2019-03-18 MED ORDER — KETOROLAC TROMETHAMINE 30 MG/ML IJ SOLN: 30.0000 mg | Freq: Once | INTRAMUSCULAR | Status: DC | PRN

## 2019-03-18 MED ORDER — EPHEDRINE SULFATE-NACL 50-0.9 MG/10ML-% IV SOSY
PREFILLED_SYRINGE | INTRAVENOUS | Status: DC | PRN
  Administered 2019-03-18: 10 mg via INTRAVENOUS

## 2019-03-18 MED ORDER — ROCURONIUM BROMIDE 10 MG/ML (PF) SYRINGE
PREFILLED_SYRINGE | INTRAVENOUS | Status: AC
  Filled 2019-03-18: qty 30

## 2019-03-18 MED ORDER — BUPIVACAINE-EPINEPHRINE (PF) 0.5% -1:200000 IJ SOLN
INTRAMUSCULAR | Status: DC | PRN
  Administered 2019-03-18: 10 mL via PERINEURAL

## 2019-03-18 MED ORDER — LACTATED RINGERS IV SOLN
INTRAVENOUS | Status: DC
  Administered 2019-03-18: 13:00:00 via INTRAVENOUS

## 2019-03-18 MED ORDER — MIDAZOLAM HCL 2 MG/2ML IJ SOLN
1.0000 mg | INTRAMUSCULAR | Status: DC | PRN
  Administered 2019-03-18: 2 mg via INTRAVENOUS

## 2019-03-18 MED ORDER — LIDOCAINE 2% (20 MG/ML) 5 ML SYRINGE
INTRAMUSCULAR | Status: DC | PRN
  Administered 2019-03-18: 50 mg via INTRAVENOUS

## 2019-03-18 MED ORDER — ONDANSETRON HCL 4 MG/2ML IJ SOLN
INTRAMUSCULAR | Status: AC
  Filled 2019-03-18: qty 2

## 2019-03-18 MED ORDER — CHLORHEXIDINE GLUCONATE CLOTH 2 % EX PADS: 6.0000 | MEDICATED_PAD | Freq: Once | CUTANEOUS | Status: DC

## 2019-03-18 MED ORDER — FENTANYL CITRATE (PF) 100 MCG/2ML IJ SOLN
50.0000 ug | INTRAMUSCULAR | Status: AC | PRN
  Administered 2019-03-18: 13:00:00 25 ug via INTRAVENOUS
  Administered 2019-03-18: 50 ug via INTRAVENOUS
  Administered 2019-03-18: 13:00:00 25 ug via INTRAVENOUS

## 2019-03-18 MED ORDER — GABAPENTIN 300 MG PO CAPS
ORAL_CAPSULE | ORAL | Status: AC
  Filled 2019-03-18: qty 1

## 2019-03-18 MED ORDER — PROPOFOL 10 MG/ML IV BOLUS
INTRAVENOUS | Status: DC | PRN
  Administered 2019-03-18: 200 mg via INTRAVENOUS

## 2019-03-18 MED ORDER — ACETAMINOPHEN 500 MG PO TABS
1000.0000 mg | ORAL_TABLET | ORAL | Status: AC
  Administered 2019-03-18: 11:00:00 1000 mg via ORAL

## 2019-03-18 MED ORDER — MIDAZOLAM HCL 2 MG/2ML IJ SOLN
INTRAMUSCULAR | Status: AC
  Filled 2019-03-18: qty 2

## 2019-03-18 MED ORDER — DEXAMETHASONE SODIUM PHOSPHATE 4 MG/ML IJ SOLN
INTRAMUSCULAR | Status: DC | PRN
  Administered 2019-03-18: 5 mg via INTRAVENOUS

## 2019-03-18 MED ORDER — GABAPENTIN 300 MG PO CAPS
300.0000 mg | ORAL_CAPSULE | ORAL | Status: AC
  Administered 2019-03-18: 300 mg via ORAL

## 2019-03-18 MED ORDER — OXYCODONE HCL 5 MG/5ML PO SOLN: 5.0000 mg | Freq: Once | ORAL | Status: AC | PRN

## 2019-03-18 MED ORDER — OXYCODONE HCL 5 MG PO TABS
5.0000 mg | ORAL_TABLET | Freq: Once | ORAL | Status: AC | PRN
  Administered 2019-03-18: 5 mg via ORAL

## 2019-03-18 MED ORDER — ACETAMINOPHEN 500 MG PO TABS
ORAL_TABLET | ORAL | Status: AC
  Filled 2019-03-18: qty 2

## 2019-03-18 MED ORDER — ARTIFICIAL TEARS OPHTHALMIC OINT
TOPICAL_OINTMENT | OPHTHALMIC | Status: AC
  Filled 2019-03-18: qty 17.5

## 2019-03-18 MED ORDER — OXYCODONE HCL 5 MG PO TABS
ORAL_TABLET | ORAL | Status: AC
  Filled 2019-03-18: qty 1

## 2019-03-18 SURGICAL SUPPLY — 68 items
ADH SKN CLS APL DERMABOND .7 (GAUZE/BANDAGES/DRESSINGS) ×1
APL PRP STRL LF DISP 70% ISPRP (MISCELLANEOUS) ×1
APL SKNCLS STERI-STRIP NONHPOA (GAUZE/BANDAGES/DRESSINGS)
APPLIER CLIP 9.375 MED OPEN (MISCELLANEOUS) ×3
APR CLP MED 9.3 20 MLT OPN (MISCELLANEOUS) ×1
BENZOIN TINCTURE PRP APPL 2/3 (GAUZE/BANDAGES/DRESSINGS) IMPLANT
BINDER BREAST LRG (GAUZE/BANDAGES/DRESSINGS) IMPLANT
BINDER BREAST MEDIUM (GAUZE/BANDAGES/DRESSINGS) IMPLANT
BINDER BREAST XLRG (GAUZE/BANDAGES/DRESSINGS) IMPLANT
BINDER BREAST XXLRG (GAUZE/BANDAGES/DRESSINGS) IMPLANT
BLADE HEX COATED 2.75 (ELECTRODE) ×3 IMPLANT
BLADE SURG 10 STRL SS (BLADE) IMPLANT
BLADE SURG 15 STRL LF DISP TIS (BLADE) ×1 IMPLANT
BLADE SURG 15 STRL SS (BLADE) ×3
CANISTER SUC SOCK COL 7IN (MISCELLANEOUS) IMPLANT
CANISTER SUCT 1200ML W/VALVE (MISCELLANEOUS) ×3 IMPLANT
CHLORAPREP W/TINT 26 (MISCELLANEOUS) ×3 IMPLANT
CLIP APPLIE 9.375 MED OPEN (MISCELLANEOUS) IMPLANT
CLOSURE WOUND 1/2 X4 (GAUZE/BANDAGES/DRESSINGS)
COVER BACK TABLE REUSABLE LG (DRAPES) ×3 IMPLANT
COVER MAYO STAND REUSABLE (DRAPES) ×3 IMPLANT
COVER PROBE W GEL 5X96 (DRAPES) ×3 IMPLANT
COVER WAND RF STERILE (DRAPES) IMPLANT
DECANTER SPIKE VIAL GLASS SM (MISCELLANEOUS) IMPLANT
DERMABOND ADVANCED (GAUZE/BANDAGES/DRESSINGS) ×2
DERMABOND ADVANCED .7 DNX12 (GAUZE/BANDAGES/DRESSINGS) ×1 IMPLANT
DRAPE HALF SHEET 70X43 (DRAPES) IMPLANT
DRAPE LAPAROSCOPIC ABDOMINAL (DRAPES) ×3 IMPLANT
DRAPE UTILITY XL STRL (DRAPES) ×3 IMPLANT
DRSG PAD ABDOMINAL 8X10 ST (GAUZE/BANDAGES/DRESSINGS) ×3 IMPLANT
ELECT REM PT RETURN 9FT ADLT (ELECTROSURGICAL) ×3
ELECTRODE REM PT RTRN 9FT ADLT (ELECTROSURGICAL) ×1 IMPLANT
GAUZE SPONGE 4X4 12PLY STRL LF (GAUZE/BANDAGES/DRESSINGS) ×3 IMPLANT
GLOVE BIO SURGEON STRL SZ 6.5 (GLOVE) ×1 IMPLANT
GLOVE BIO SURGEONS STRL SZ 6.5 (GLOVE) ×1
GLOVE BIOGEL PI IND STRL 6.5 (GLOVE) IMPLANT
GLOVE BIOGEL PI INDICATOR 6.5 (GLOVE) ×2
GLOVE EUDERMIC 7 POWDERFREE (GLOVE) ×3 IMPLANT
GOWN STRL REUS W/ TWL LRG LVL3 (GOWN DISPOSABLE) ×1 IMPLANT
GOWN STRL REUS W/ TWL XL LVL3 (GOWN DISPOSABLE) ×1 IMPLANT
GOWN STRL REUS W/TWL LRG LVL3 (GOWN DISPOSABLE) ×3
GOWN STRL REUS W/TWL XL LVL3 (GOWN DISPOSABLE) ×3
ILLUMINATOR WAVEGUIDE N/F (MISCELLANEOUS) IMPLANT
KIT MARKER MARGIN INK (KITS) ×3 IMPLANT
LIGHT WAVEGUIDE WIDE FLAT (MISCELLANEOUS) IMPLANT
NDL HYPO 25X1 1.5 SAFETY (NEEDLE) ×1 IMPLANT
NEEDLE HYPO 25X1 1.5 SAFETY (NEEDLE) ×3 IMPLANT
NS IRRIG 1000ML POUR BTL (IV SOLUTION) ×3 IMPLANT
PACK BASIN DAY SURGERY FS (CUSTOM PROCEDURE TRAY) ×3 IMPLANT
PENCIL BUTTON HOLSTER BLD 10FT (ELECTRODE) ×3 IMPLANT
SLEEVE SCD COMPRESS KNEE MED (MISCELLANEOUS) ×3 IMPLANT
SPONGE LAP 18X18 RF (DISPOSABLE) IMPLANT
SPONGE LAP 4X18 RFD (DISPOSABLE) ×3 IMPLANT
STRIP CLOSURE SKIN 1/2X4 (GAUZE/BANDAGES/DRESSINGS) IMPLANT
SUT ETHILON 3 0 FSL (SUTURE) IMPLANT
SUT MNCRL AB 4-0 PS2 18 (SUTURE) ×3 IMPLANT
SUT SILK 2 0 SH (SUTURE) ×3 IMPLANT
SUT VIC AB 2-0 CT1 27 (SUTURE)
SUT VIC AB 2-0 CT1 TAPERPNT 27 (SUTURE) IMPLANT
SUT VIC AB 3-0 SH 27 (SUTURE)
SUT VIC AB 3-0 SH 27X BRD (SUTURE) IMPLANT
SUT VICRYL 3-0 CR8 SH (SUTURE) ×3 IMPLANT
SYR 10ML LL (SYRINGE) ×3 IMPLANT
TOWEL GREEN STERILE FF (TOWEL DISPOSABLE) ×3 IMPLANT
TRAY FAXITRON CT DISP (TRAY / TRAY PROCEDURE) ×3 IMPLANT
TUBE CONNECTING 20'X1/4 (TUBING) ×1
TUBE CONNECTING 20X1/4 (TUBING) ×2 IMPLANT
YANKAUER SUCT BULB TIP NO VENT (SUCTIONS) ×3 IMPLANT

## 2019-03-18 NOTE — Transfer of Care (Signed)
Immediate Anesthesia Transfer of Care Note  Patient: [name]  Procedure(s) Performed: RIGHT BREAST LUMPECTOMY X 2  WITH RIGHT RADIOACTIVE SEED LOCALIZATION X 2 (Right Breast)  Patient Location: PACU  Anesthesia Type:General  Level of Consciousness: awake  Airway & Oxygen Therapy: Patient Spontanous Breathing and Patient connected to face mask oxygen  Post-op Assessment: Report given to RN and Post -op Vital signs reviewed and stable  Post vital signs: Reviewed and stable  Last Vitals:  Vitals Value Taken Time  BP 121/62 03/18/19 1415  Temp 36.7 C 03/18/19 1409  Pulse 64 03/18/19 1425  Resp 15 03/18/19 1425  SpO2 100 % 03/18/19 1425  Vitals shown include unvalidated device data.  Last Pain:  Vitals:   03/18/19 1409  TempSrc:   PainSc: 0-No pain         Complications: No apparent anesthesia complications

## 2019-03-18 NOTE — Anesthesia Preprocedure Evaluation (Signed)
Anesthesia Evaluation  Patient identified by MRN, date of birth, ID band Patient awake    Reviewed: Allergy & Precautions, NPO status , Patient's Chart, lab work & pertinent test results  Airway Mallampati: II  TM Distance: >3 FB Neck ROM: Full    Dental no notable dental hx.    Pulmonary neg pulmonary ROS,    Pulmonary exam normal breath sounds clear to auscultation       Cardiovascular hypertension, Normal cardiovascular exam Rhythm:Regular Rate:Normal     Neuro/Psych negative neurological ROS  negative psych ROS   GI/Hepatic negative GI ROS, Neg liver ROS,   Endo/Other  diabetesobesity  Renal/GU negative Renal ROS  negative genitourinary   Musculoskeletal negative musculoskeletal ROS (+)   Abdominal   Peds negative pediatric ROS (+)  Hematology negative hematology ROS (+)   Anesthesia Other Findings   Reproductive/Obstetrics negative OB ROS                             Anesthesia Physical Anesthesia Plan  ASA: II  Anesthesia Plan: General   Post-op Pain Management:    Induction: Intravenous  PONV Risk Score and Plan: 3 and Ondansetron, Dexamethasone and Treatment may vary due to age or medical condition  Airway Management Planned: LMA  Additional Equipment:   Intra-op Plan:   Post-operative Plan: Extubation in OR  Informed Consent: I have reviewed the patients History and Physical, chart, labs and discussed the procedure including the risks, benefits and alternatives for the proposed anesthesia with the patient or authorized representative who has indicated his/her understanding and acceptance.     Dental advisory given  Plan Discussed with: CRNA and Surgeon  Anesthesia Plan Comments:         Anesthesia Quick Evaluation

## 2019-03-18 NOTE — Anesthesia Postprocedure Evaluation (Signed)
Anesthesia Post Note  Patient: [name]  Procedure(s) Performed: RIGHT BREAST LUMPECTOMY X 2  WITH RIGHT RADIOACTIVE SEED LOCALIZATION X 2 (Right Breast)     Patient location during evaluation: PACU Anesthesia Type: General Level of consciousness: awake and alert Pain management: pain level controlled Vital Signs Assessment: post-procedure vital signs reviewed and stable Respiratory status: spontaneous breathing, nonlabored ventilation, respiratory function stable and patient connected to nasal cannula oxygen Cardiovascular status: blood pressure returned to baseline and stable Postop Assessment: no apparent nausea or vomiting Anesthetic complications: no    Last Vitals:  Vitals:   03/18/19 1430 03/18/19 1445  BP: 116/65 119/68  Pulse: (!) 55 66  Resp: 15 20  Temp:    SpO2: 100% 97%    Last Pain:  Vitals:   03/18/19 1445  TempSrc:   PainSc: 3                  Glema Takaki S

## 2019-03-18 NOTE — Op Note (Signed)
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 Dalbert Batman, M.D., FACS  Assistant:                      OR staff  Operative Indications:      [name] Location: [facility] Patient #: [id number] DOB: 1950/09/18 Married / Language: English / Race: Black or African American Female         History of Present Illness        This is a 68 year old female, referred by Dr. Margarette Canada at the [facility] for multifocal lobular carcinoma in situ right breast. [name] is her PCP.       Recent screening mammogram showed 2 areas of calcifications on the right. One area is lateral, 15 mm diameter a second area is right medial and inferior to the first grade, 8 mm in diameter. These are 2.5 cm apart. Because of these areas were biopsied and both showed lobular carcinoma in situ. There was no pleomorphic change.       Past history is negative for breast disease.  Family history negative for breast ovarian or pancreatic cancer. Father had prostate cancer but this was not aggressive. Mother had diabetes and heart disease.      I explained lobular carcinoma in situ to her. I told her this was not a true cancer that needed treatment but that it is a high risk finding a core biopsy and should be excised to rule out invasive cancer. She agrees. She'll be scheduled for right breast lumpectomy 2 with radioactive seed localization 2.  Operative Findings:       I was able to remove both seeds and both original biopsy clips through a single incision at the 12 o'clock position of the right breast.  The single specimen mammogram looked good showing both seeds and both clips.  It did not appear to be at the  edge of the specimen.  Both of these areas were deep within the posterior third of the breast centrally 12 o'clock position.  The dissection actually went all the way down to the pectoralis muscle in one area.  Procedure in Detail:          Following the induction of general LMA anesthesia the patient's right breast was prepped and draped in sterile fashion.  Intravenous antibiotics were given.  Surgical timeout was performed.  0.5% Marcaine with epinephrine was used as a local infiltration anesthetic.    Using the neoprobe I mapped out the radioactive seed signals.  I planned a transverse curvilinear incision at the 12 o'clock position about 3 cm above the areolar margin.  The incision was made with a knife.  The lumpectomy was performed with the neoprobe and electrocautery.  The specimen was removed and marked with silk sutures and a 6 color ink kit.  The specimen mammogram looked very good as described above.  The specimen was sent to the lab  where the seeds were retrieved.  The wound was irrigated.  Hemostasis was excellent.  5 metal marker clips were placed in the walls of the lumpectomy cavity.  The lumpectomy cavity was closed in several layers with 2-0 Vicryl and 3-0 Vicryl.  The skin was closed with running subcuticular 4-0 Monocryl and Dermabond.  Dry bandages and a breast binder were placed.  The patient tolerated the procedure well and was taken to PACU in stable condition.  EBL 20 cc.  Counts correct.  Complications none.    Addendum: I logged onto the PMP aware website and reviewed her prescription medication history.     Edsel Petrin. Dalbert Batman, M.D., FACS General and Minimally Invasive Surgery Breast and Colorectal Surgery  03/18/2019 2:15 PM

## 2019-03-18 NOTE — Discharge Instructions (Signed)
[place] Office Phone Number [phone]  BREAST BIOPSY/ LUMPECTOMY: POST OP INSTRUCTIONS  Always review your discharge instruction sheet given to you by the facility where your surgery was performed.  IF YOU HAVE DISABILITY OR FAMILY LEAVE FORMS, YOU MUST BRING THEM TO THE OFFICE FOR PROCESSING.  DO NOT GIVE THEM TO YOUR DOCTOR.  1. A prescription for pain medication may be given to you upon discharge.  Take your pain medication as prescribed, if needed.  If narcotic pain medicine is not needed, then you may take acetaminophen (Tylenol) (1,000mg  Tylenol taken at 11:27am - No Tylenol until 5:30pm if needed) or ibuprofen (Advil) as needed. 2. Take your usually prescribed medications unless otherwise directed 3. If you need a refill on your pain medication, please contact your pharmacy.  They will contact our office to request authorization.  Prescriptions will not be filled after 5pm or on week-ends. 4. You should eat very light the first 24 hours after surgery, such as soup, crackers, pudding, etc.  Resume your normal diet the day after surgery. 5. Most patients will experience some swelling and bruising in the breast.  Ice packs and a good support bra will help.  Swelling and bruising can take several days to resolve.  6. It is common to experience some constipation if taking pain medication after surgery.  Increasing fluid intake and taking a stool softener will usually help or prevent this problem from occurring.  A mild laxative (Milk of Magnesia or Miralax) should be taken according to package directions if there are no bowel movements after 48 hours. 7. Unless discharge instructions indicate otherwise, you may remove your bandages 24-48 hours after surgery, and you may shower at that time.  You may have steri-strips (small skin tapes) in place directly over the incision.  These strips should be left on the skin for 7-10 days.  If your surgeon used skin glue on the incision,  you may shower in 24 hours.  The glue will flake off over the next 2-3 weeks.  Any sutures or staples will be removed at the office during your follow-up visit. 8. ACTIVITIES:  You may resume regular daily activities (gradually increasing) beginning the next day.  Wearing a good support bra or sports bra minimizes pain and swelling.  You may have sexual intercourse when it is comfortable. a. You may drive when you no longer are taking prescription pain medication, you can comfortably wear a seatbelt, and you can safely maneuver your car and apply brakes. b. RETURN TO WORK:  ______________________________________________________________________________________ 9. You should see your doctor in the office for a follow-up appointment approximately two weeks after your surgery.  Your doctors nurse will typically make your follow-up appointment when she calls you with your pathology report.  Expect your pathology report 2-3 business days after your surgery.  You may call to check if you do not hear from [place] after three days. 10. OTHER INSTRUCTIONS: _______________________________________________________________________________________________ _____________________________________________________________________________________________________________________________________ _____________________________________________________________________________________________________________________________________ _____________________________________________________________________________________________________________________________________  WHEN TO CALL YOUR DOCTOR: 1. Fever over 101.0 2. Nausea and/or vomiting. 3. Extreme swelling or bruising. 4. Continued bleeding from incision. 5. Increased pain, redness, or drainage from the incision.  The clinic staff is available to answer your questions during regular business hours.  Please dont hesitate to call and ask to speak to one of the nurses for clinical  concerns.  If you have a medical emergency, go to the nearest emergency room or call 911.  A surgeon from [facility] is always on call  at the hospital.  For further questions, please visit [email or web address]              Managing Your Pain After Surgery Without Opioids    Thank you for participating in our program to help patients manage their pain after surgery without opioids. This is part of our effort to provide you with the best care possible, without exposing you or your family to the risk that opioids pose.  What pain can I expect after surgery? You can expect to have some pain after surgery. This is normal. The pain is typically worse the day after surgery, and quickly begins to get better. Many studies have found that many patients are able to manage their pain after surgery with Over-the-Counter (OTC) medications such as Tylenol and Motrin. If you have a condition that does not allow you to take Tylenol or Motrin, notify your surgical team.  How will I manage my pain? The best strategy for controlling your pain after surgery is around the clock pain control with Tylenol (acetaminophen) and Motrin (ibuprofen or Advil). Alternating these medications with each other allows you to maximize your pain control. In addition to Tylenol and Motrin, you can use heating pads or ice packs on your incisions to help reduce your pain.  How will I alternate your regular strength over-the-counter pain medication? You will take a dose of pain medication every three hours. ; Start by taking 650 mg of Tylenol (2 pills of 325 mg) ; 3 hours later take 600 mg of Motrin (3 pills of 200 mg) ; 3 hours after taking the Motrin take 650 mg of Tylenol ; 3 hours after that take 600 mg of Motrin.   - 1 -  See example - if your first dose of Tylenol is at 12:00 PM   12:00 PM Tylenol 650 mg (2 pills of 325 mg)  3:00 PM Motrin 600 mg (3 pills of 200 mg)  6:00 PM  Tylenol 650 mg (2 pills of 325 mg)  9:00 PM Motrin 600 mg (3 pills of 200 mg)  Continue alternating every 3 hours   We recommend that you follow this schedule around-the-clock for at least 3 days after surgery, or until you feel that it is no longer needed. Use the table on the last page of this handout to keep track of the medications you are taking. Important: Do not take more than 3000mg  of Tylenol or 3200mg  of Motrin in a 24-hour period. Do not take ibuprofen/Motrin if you have a history of bleeding stomach ulcers, severe kidney disease, &/or actively taking a blood thinner  What if I still have pain? If you have pain that is not controlled with the over-the-counter pain medications (Tylenol and Motrin or Advil) you might have what we call breakthrough pain. You will receive a prescription for a small amount of an opioid pain medication such as Oxycodone, Tramadol, or Tylenol with Codeine. Use these opioid pills in the first 24 hours after surgery if you have breakthrough pain. Do not take more than 1 pill every 4-6 hours.  If you still have uncontrolled pain after using all opioid pills, don't hesitate to call our staff using the number provided. We will help make sure you are managing your pain in the best way possible, and if necessary, we can provide a prescription for additional pain medication.   Day 1    Time  Name of Medication Number of pills taken  Amount of Acetaminophen  Pain Level  Comments  AM PM       AM PM       AM PM       AM PM       AM PM       AM PM       AM PM       AM PM       Total Daily amount of Acetaminophen Do not take more than  3,000 mg per day      Day 2    Time  Name of Medication Number of pills taken  Amount of Acetaminophen  Pain Level   Comments  AM PM       AM PM       AM PM       AM PM       AM PM       AM PM       AM PM       AM PM       Total Daily amount of Acetaminophen Do not take more than  3,000 mg per day       Day 3    Time  Name of Medication Number of pills taken  Amount of Acetaminophen  Pain Level   Comments  AM PM       AM PM       AM PM       AM PM          AM PM       AM PM       AM PM       AM PM       Total Daily amount of Acetaminophen Do not take more than  3,000 mg per day      Day 4    Time  Name of Medication Number of pills taken  Amount of Acetaminophen  Pain Level   Comments  AM PM       AM PM       AM PM       AM PM       AM PM       AM PM       AM PM       AM PM       Total Daily amount of Acetaminophen Do not take more than  3,000 mg per day      Day 5    Time  Name of Medication Number of pills taken  Amount of Acetaminophen  Pain Level   Comments  AM PM       AM PM       AM PM       AM PM       AM PM       AM PM       AM PM       AM PM       Total Daily amount of Acetaminophen Do not take more than  3,000 mg per day       Day 6    Time  Name of Medication Number of pills taken  Amount of Acetaminophen  Pain Level  Comments  AM PM       AM PM       AM PM       AM PM       AM PM       AM PM       AM  PM       AM PM       Total Daily amount of Acetaminophen Do not take more than  3,000 mg per day      Day 7    Time  Name of Medication Number of pills taken  Amount of Acetaminophen  Pain Level   Comments  AM PM       AM PM       AM PM       AM PM       AM PM       AM PM       AM PM       AM PM       Total Daily amount of Acetaminophen Do not take more than  3,000 mg per day        For additional information about how and where to safely dispose of unused opioid medications - [employer]  Disclaimer: This document contains information and/or instructional materials adapted from [employer] for the typical patient with your condition. It does not replace medical advice from your health care provider because your experience may differ from that of the typical patient.  Talk to your health care provider if you have any questions about this document, your condition or your treatment plan. Adapted from [employer] Instructions  Activity: Get plenty of rest for the remainder of the day. A responsible individual must stay with you for 24 hours following the procedure.  For the next 24 hours, DO NOT: -Drive a car -Paediatric nurse -Drink alcoholic beverages -Take any medication unless instructed by your physician -Make any legal decisions or sign important papers.  Meals: Start with liquid foods such as gelatin or soup. Progress to regular foods as tolerated. Avoid greasy, spicy, heavy foods. If nausea and/or vomiting occur, drink only clear liquids until the nausea and/or vomiting subsides. Call your physician if vomiting continues.  Special Instructions/Symptoms: Your throat may feel dry or sore from the anesthesia or the breathing tube placed in your throat during surgery. If this causes discomfort, gargle with warm salt water. The discomfort should disappear within 24 hours.  If you had a scopolamine patch placed behind your ear for the management of post- operative nausea and/or vomiting:  1. The medication in the patch is effective for 72 hours, after which it should be removed.  Wrap patch in a tissue and discard in the trash. Wash hands thoroughly with soap and water. 2. You may remove the patch earlier than 72 hours if you experience unpleasant side effects which may include dry mouth, dizziness or visual disturbances. 3. Avoid touching the patch. Wash your hands with soap and water after contact with the patch.

## 2019-03-18 NOTE — Anesthesia Procedure Notes (Signed)
Procedure Name: LMA Insertion Date/Time: 03/18/2019 1:02 PM Performed by: Lieutenant Diego, CRNA Pre-anesthesia Checklist: Patient identified, Emergency Drugs available, Suction available and Patient being monitored Patient Re-evaluated:Patient Re-evaluated prior to induction Oxygen Delivery Method: Circle system utilized Preoxygenation: Pre-oxygenation with 100% oxygen Induction Type: IV induction Ventilation: Mask ventilation without difficulty LMA: LMA inserted LMA Size: 4.0 Number of attempts: 1 Placement Confirmation: positive ETCO2 and breath sounds checked- equal and bilateral Tube secured with: Tape Dental Injury: Teeth and Oropharynx as per pre-operative assessment

## 2019-03-18 NOTE — Interval H&P Note (Signed)
History and Physical Interval Note:  03/18/2019 12:42 PM  [name]  has presented today for surgery, with the diagnosis of LOBULAR CARCINOMA IN SITU OF RIGHT BREAST.  The various methods of treatment have been discussed with the patient and family. After consideration of risks, benefits and other options for treatment, the patient has consented to  Procedure(s): RIGHT BREAST LUMPECTOMY X 2  WITH RIGHT RADIOACTIVE SEED LOCALIZATION X 2 (Right) as a surgical intervention.  The patient's history has been reviewed, patient examined, no change in status, stable for surgery.  I have reviewed the patient's chart and labs.  Questions were answered to the patient's satisfaction.     Adin Hector

## 2019-03-19 ENCOUNTER — Encounter (HOSPITAL_BASED_OUTPATIENT_CLINIC_OR_DEPARTMENT_OTHER): Payer: Self-pay | Admitting: General Surgery

## 2019-03-26 ENCOUNTER — Encounter: Payer: Self-pay | Admitting: Radiation Oncology

## 2019-03-26 ENCOUNTER — Ambulatory Visit
Admission: RE | Admit: 2019-03-26 | Discharge: 2019-03-26 | Disposition: A | Discharge status: Home / Self Care | Payer: Medicare Other | Source: Ambulatory Visit | Attending: Radiation Oncology | Admitting: Radiation Oncology

## 2019-03-26 ENCOUNTER — Other Ambulatory Visit: Payer: Self-pay

## 2019-03-26 VITALS — BP 147/75 | HR 85 | Temp 96.2°F | Resp 18 | Ht 65.0 in | Wt 240.5 lb

## 2019-03-26 DIAGNOSIS — E669 Obesity, unspecified: Secondary | ICD-10-CM | POA: Diagnosis not present

## 2019-03-26 DIAGNOSIS — Z7982 Long term (current) use of aspirin: Secondary | ICD-10-CM | POA: Diagnosis not present

## 2019-03-26 DIAGNOSIS — Z9889 Other specified postprocedural states: Secondary | ICD-10-CM | POA: Diagnosis not present

## 2019-03-26 DIAGNOSIS — Z170 Estrogen receptor positive status [ER+]: Secondary | ICD-10-CM | POA: Diagnosis not present

## 2019-03-26 DIAGNOSIS — I10 Essential (primary) hypertension: Secondary | ICD-10-CM | POA: Insufficient documentation

## 2019-03-26 DIAGNOSIS — I872 Venous insufficiency (chronic) (peripheral): Secondary | ICD-10-CM | POA: Diagnosis not present

## 2019-03-26 DIAGNOSIS — D0511 Intraductal carcinoma in situ of right breast: Secondary | ICD-10-CM | POA: Diagnosis not present

## 2019-03-26 DIAGNOSIS — E785 Hyperlipidemia, unspecified: Secondary | ICD-10-CM | POA: Insufficient documentation

## 2019-03-26 DIAGNOSIS — Z809 Family history of malignant neoplasm, unspecified: Secondary | ICD-10-CM | POA: Insufficient documentation

## 2019-03-26 DIAGNOSIS — E119 Type 2 diabetes mellitus without complications: Secondary | ICD-10-CM | POA: Insufficient documentation

## 2019-03-26 DIAGNOSIS — Z7984 Long term (current) use of oral hypoglycemic drugs: Secondary | ICD-10-CM | POA: Diagnosis not present

## 2019-03-26 DIAGNOSIS — D0501 Lobular carcinoma in situ of right breast: Secondary | ICD-10-CM

## 2019-03-26 NOTE — Patient Instructions (Signed)
Coronavirus (COVID-19) Are you at risk?  Are you at risk for the Coronavirus (COVID-19)?  To be considered HIGH RISK for Coronavirus (COVID-19), you have to meet the following criteria:  . Traveled to [place], [place], [place], [place] or [place]; or in the [place] to [place], [place], [place], or [place]; and have fever, cough, and shortness of breath within the last 2 weeks of travel OR . Been in close contact with a person diagnosed with COVID-19 within the last 2 weeks and have fever, cough, and shortness of breath . IF YOU DO NOT MEET THESE CRITERIA, YOU ARE CONSIDERED LOW RISK FOR COVID-19.  What to do if you are HIGH RISK for COVID-19?  . If you are having a medical emergency, call 911. . Seek medical care right away. Before you go to a doctor's office, urgent care or emergency department, call ahead and tell them about your recent travel, contact with someone diagnosed with COVID-19, and your symptoms. You should receive instructions from your physician's office regarding next steps of care.  . When you arrive at healthcare provider, tell the healthcare staff immediately you have returned from visiting [place], [place], [place], [place] or [place]; or traveled in the [place] to [place], [place], [place], or [place]; in the last two weeks or you have been in close contact with a person diagnosed with COVID-19 in the last 2 weeks.   . Tell the health care staff about your symptoms: fever, cough and shortness of breath. . After you have been seen by a medical provider, you will be either: o Tested for (COVID-19) and discharged home on quarantine except to seek medical care if symptoms worsen, and asked to  - Stay home and avoid contact with others until you get your results (4-5 days)  - Avoid travel on public transportation if possible (such as bus, train, or airplane) or o Sent to the Emergency Department by EMS for evaluation, COVID-19 testing, and possible  admission depending on your condition and test results.  What to do if you are LOW RISK for COVID-19?  Reduce your risk of any infection by using the same precautions used for avoiding the common cold or flu:  . Wash your hands often with soap and warm water for at least 20 seconds.  If soap and water are not readily available, use an alcohol-based hand sanitizer with at least 60% alcohol.  . If coughing or sneezing, cover your mouth and nose by coughing or sneezing into the elbow areas of your shirt or coat, into a tissue or into your sleeve (not your hands). . Avoid shaking hands with others and consider head nods or verbal greetings only. . Avoid touching your eyes, nose, or mouth with unwashed hands.  . Avoid close contact with people who are sick. . Avoid places or events with large numbers of people in one location, like concerts or sporting events. . Carefully consider travel plans you have or are making. . If you are planning any travel outside or inside the [place], visit the CDC's Travelers' Health webpage for the latest health notices. . If you have some symptoms but not all symptoms, continue to monitor at home and seek medical attention if your symptoms worsen. . If you are having a medical emergency, call 911.   ADDITIONAL HEALTHCARE OPTIONS FOR PATIENTS  [facility] / e-Visit: [email or web address]         [facility]: [phone]  [facility]   [facility]: [phone]                   [facility]: [phone]   

## 2019-03-26 NOTE — Progress Notes (Signed)
Radiation Oncology         [phone] ________________________________  Initial Outpatient Consultation  Name: [name] MRN: [id number]  Date: 03/26/2019  DOB: [birth year]  FM:BWGY, Berneta Sages, MD  Fanny Skates, MD   REFERRING PHYSICIAN: Fanny Skates, MD  DIAGNOSIS: Stage 0 (pTis, pNX, cMX) Right Breast  Ductal Carcinoma In Situ, ER+ / PR+, Grade 2  HISTORY OF PRESENT ILLNESS::[name] is a 68 y.o. female with a new breast cancer diagnosis. She had routine screening mammography on 01/30/2019 showing a possible abnormality in the right breast. She underwent right diagnostic mammography with tomography at [facility] on 02/11/2019 showing: two indeterminate groups of calcifications within the upper right breast.  Biopsy on 02/19/2019 showed: lobular carcinoma in situ with calcifications in both cores.   She opted to proceed with right breast lumpectomy on 03/18/2019. Pathology from the procedure revealed: ductal carcinoma in situ, intermediate nuclear grade, cribriform pattern, focally involving small intraductal papilloma, about 4 cm; margins negative for DCIS; no evidence of invasive carcinoma. Prognostic indicators significant for: estrogen receptor, 100% positive and progesterone receptor, 50% positive, both with strong staining intensity.    PREVIOUS RADIATION THERAPY: No  PAST MEDICAL HISTORY:  Past Medical History:  Diagnosis Date   Diabetes mellitus without complication ([place])    Hyperlipidemia    Hypertension    Lobular carcinoma in situ (LCIS) of right breast 03/18/2019   Obesity    Stasis dermatitis    Venous insufficiency     PAST SURGICAL HISTORY: Past Surgical History:  Procedure Laterality Date   ABDOMINAL HYSTERECTOMY     BREAST LUMPECTOMY WITH RADIOACTIVE SEED LOCALIZATION Right 03/18/2019   Procedure: RIGHT BREAST LUMPECTOMY X 2  WITH RIGHT RADIOACTIVE SEED LOCALIZATION X 2;  Surgeon: Fanny Skates, MD;  Location:  [facility];  Service: General;  Laterality: Right;   CHOLECYSTECTOMY     COLONOSCOPY N/A 07/02/2013   Procedure: COLONOSCOPY;  Surgeon: Danie Binder, MD;  [facility];  Service: Endoscopy;  Laterality: N/A;  9:15 AM    FAMILY HISTORY:  Family History  Problem Relation Age of Onset   Diabetes Mother    Heart disease Mother    Hypertension Mother    Cancer Father    Colon cancer Neg Hx     SOCIAL HISTORY:  Social History   Tobacco Use   Smoking status: Never Smoker   Smokeless tobacco: Never Used  Substance Use Topics   Alcohol use: No   Drug use: No    ALLERGIES: No Known Allergies  MEDICATIONS:  Current Outpatient Medications  Medication Sig Dispense Refill   aspirin 81 MG tablet Take 81 mg by mouth daily.     metFORMIN (GLUCOPHAGE) 500 MG tablet Take by mouth 2 (two) times daily with a meal.     pravastatin (PRAVACHOL) 40 MG tablet Take 40 mg by mouth daily.     spironolactone-hydrochlorothiazide (ALDACTAZIDE) 25-25 MG per tablet Take 1 tablet by mouth daily.      traMADol (ULTRAM) 50 MG tablet Take 50 mg by mouth every 8 (eight) hours as needed for pain.     No current facility-administered medications for this encounter.     REVIEW OF SYSTEMS:  A 10+ POINT REVIEW OF SYSTEMS WAS OBTAINED including neurology, dermatology, psychiatry, cardiac, respiratory, lymph, extremities, GI, GU, musculoskeletal, constitutional, reproductive, HEENT. She denies any lymphedema issues, pain, and any other symptoms.   PHYSICAL EXAM:  height is _0  (1.651 m) and weight is  240 lb 8 oz (109.1 kg). Her temporal temperature is 96.2 F (35.7 C) (abnormal). Her blood pressure is 147/75 (abnormal) and her pulse is 85. Her respiration is 18 and oxygen saturation is 100%.   General: Alert and oriented, in no acute distress HEENT: Head is normocephalic. Extraocular movements are intact. Oropharynx is clear. Neck: Neck is supple, no palpable cervical  or supraclavicular lymphadenopathy. Heart: Regular in rate and rhythm with no murmurs, rubs, or gallops. Chest: Clear to auscultation bilaterally, with no rhonchi, wheezes, or rales. Abdomen: Soft, nontender, nondistended, with no rigidity or guarding. Extremities: No cyanosis or edema. Lymphatics: see Neck Exam Skin: No concerning lesions. Musculoskeletal: symmetric strength and muscle tone throughout. Neurologic: Cranial nerves II through XII are grossly intact. No obvious focalities. Speech is fluent. Coordination is intact. Psychiatric: Judgment and insight are intact. Affect is appropriate. Left Breast: no palpable mass, nipple discharge or bleeding. Right Breast: patient has a long, horizontal scar in the upper aspect of the breast, surgical glue in place; no signs of drainage or infection; patient does have a fair amount of swelling in the upper aspect from surgery.   ECOG = 1  0 - Asymptomatic (Fully active, able to carry on all predisease activities without restriction)  1 - Symptomatic but completely ambulatory (Restricted in physically strenuous activity but ambulatory and able to carry out work of a light or sedentary nature. For example, light housework, office work)  2 - Symptomatic, <50% in bed during the day (Ambulatory and capable of all self care but unable to carry out any work activities. Up and about more than 50% of waking hours)  3 - Symptomatic, >50% in bed, but not bedbound (Capable of only limited self-care, confined to bed or chair 50% or more of waking hours)  4 - Bedbound (Completely disabled. Cannot carry on any self-care. Totally confined to bed or chair)  5 - Death   Eustace Pen MM, Creech RH, Tormey DC, et al. [phone]). "Toxicity and response criteria of the [facility]". [place] Oncol. 5 (6): 649-55  LABORATORY DATA:  Lab Results  Component Value Date   WBC 6.2 03/14/2019   HGB 12.3 03/14/2019   HCT 37.9 03/14/2019   MCV 90.0  03/14/2019   PLT 206 03/14/2019   NEUTROABS 4.1 03/14/2019   Lab Results  Component Value Date   NA 142 03/14/2019   K 3.5 03/14/2019   CL 101 03/14/2019   CO2 29 03/14/2019   GLUCOSE 106 (H) 03/14/2019   CREATININE 1.34 (H) 03/14/2019   CALCIUM 9.8 03/14/2019      RADIOGRAPHY: Mm Breast Surgical Specimen  Result Date: 03/18/2019 CLINICAL DATA:  Post right breast lumpectomy. EXAM: SPECIMEN RADIOGRAPH OF THE RIGHT BREAST COMPARISON:  Previous exam(s). FINDINGS: Status post excision of the right breast. Two radioactive seeds and coil shaped and X shaped biopsy marking clips are present, completely intact, and were marked for pathology. IMPRESSION: Specimen radiograph of the right breast. Electronically Signed   By: Everlean Alstrom M.D.   On: 03/18/2019 13:46   Mm Rt Radioactive Seed Loc Mammo Guide  Result Date: 03/17/2019 CLINICAL DATA:  Preoperative radioactive seed localization for 2 areas of LCIS in the right breast upper inner quadrant. EXAM: MAMMOGRAPHIC GUIDED RADIOACTIVE SEED LOCALIZATION OF THE RIGHT BREAST COMPARISON:  Previous exam(s). FINDINGS: Patient presents for radioactive seed localization prior to right breast excisional biopsy. I met with the patient and we discussed the procedure of seed localization including benefits and alternatives. We  discussed the high likelihood of a successful procedure. We discussed the risks of the procedure including infection, bleeding, tissue injury and further surgery. We discussed the low dose of radioactivity involved in the procedure. Informed, written consent was given. The usual time-out protocol was performed immediately prior to the procedure. Using mammographic guidance, sterile technique, 1% lidocaine and an I-125 radioactive seed, coil shaped marker was localized using a medial approach. Order number of I-125 seed:  [id number]. Total activity:  0.569 millicurie reference Date: March 14, 2019 Next, using mammographic guidance, sterile  technique, 1% lidocaine and an I-125 radioactive seed, X shaped marker was localized using a medial approach. Order number of I-125 seed:  [id number]. Total activity:  1.655 millicurie reference Date: March 14, 2019 The follow-up mammogram images confirm the seed in the expected location and were marked for Dr. Dalbert Batman. Follow-up survey of the patient confirms presence of the radioactive seeds. The patient tolerated the procedure well and was released from the [facility]. She was given instructions regarding seed removal. IMPRESSION: Radioactive seed localization of 2 areas of LCIS in the right breast. No apparent complications. Electronically Signed   By: Fidela Salisbury M.D.   On: 03/17/2019 14:58   Mm Rt Radio Seed Ea Add Lesion Loc Mammo  Result Date: 03/17/2019 CLINICAL DATA:  Preoperative radioactive seed localization for 2 areas of LCIS in the right breast upper inner quadrant. EXAM: MAMMOGRAPHIC GUIDED RADIOACTIVE SEED LOCALIZATION OF THE RIGHT BREAST COMPARISON:  Previous exam(s). FINDINGS: Patient presents for radioactive seed localization prior to right breast excisional biopsy. I met with the patient and we discussed the procedure of seed localization including benefits and alternatives. We discussed the high likelihood of a successful procedure. We discussed the risks of the procedure including infection, bleeding, tissue injury and further surgery. We discussed the low dose of radioactivity involved in the procedure. Informed, written consent was given. The usual time-out protocol was performed immediately prior to the procedure. Using mammographic guidance, sterile technique, 1% lidocaine and an I-125 radioactive seed, coil shaped marker was localized using a medial approach. Order number of I-125 seed:  [id number]. Total activity:  6.754 millicurie reference Date: March 14, 2019 Next, using mammographic guidance, sterile technique, 1% lidocaine and an I-125 radioactive seed, X shaped marker  was localized using a medial approach. Order number of I-125 seed:  [id number]. Total activity:  0.071 millicurie reference Date: March 14, 2019 The follow-up mammogram images confirm the seed in the expected location and were marked for Dr. Dalbert Batman. Follow-up survey of the patient confirms presence of the radioactive seeds. The patient tolerated the procedure well and was released from the Breast Center. She was given instructions regarding seed removal. IMPRESSION: Radioactive seed localization of 2 areas of LCIS in the right breast. No apparent complications. Electronically Signed   By: Fidela Salisbury M.D.   On: 03/17/2019 14:58      IMPRESSION: Right Breast DCIS  Given the size of the patient's tumor (4 cm), as well as the clear but close margins, I would recommend adjuvant radiation therapy for this patient. The patient has a lot of family responsibility taking care of her sister, mother, as well as her brother. So she is unsure as to whether she can reasonably consider adjuvant radiation therapy. I discussed with the patient that another option would be to consider mastectomy so as to avoid the need for radiation therapy, but this would be a drastic surgery for her pathologic findings. The other option would be  to not do radiation therapy realizing that she would have a higher risk for local recurrence. The patient would also likely benefit from adjuvant hormonal therapy given her ER+/PR+ tumor with strong staining. She will need to be set up with medical oncology for this issue.  I tentatively scheduled the patient for CT simulation and planning the day after she sees Dr. Dalbert Batman. We can cancel this simulation if she decides not to pursue radiation therapy.  Today, I talked to the patient about the findings and work-up thus far.  We discussed the natural history of breast cancer (DCIS) and general treatment, highlighting the role of radiotherapy in the management.  We discussed the available  radiation techniques, and focused on the details of logistics and delivery.  We reviewed the anticipated acute and late sequelae associated with radiation in this setting.  The patient was encouraged to ask questions that I answered to the best of my ability. A patient consent form was discussed. She is not ready to sign this yet until she has discussed further with family members.   PLAN: 1. Medical Oncology evaluation 2. Adjuvant radiation, if the patient so decides  She is scheduled for surgical follow up on 04/22/2019.     ------------------------------------------------  Blair Promise, PhD, MD  This document serves as a record of services personally performed by Gery Pray, MD. It was created on his behalf by Wilburn Mylar, a trained medical scribe. The creation of this record is based on the scribe's personal observations and the provider's statements to them. This document has been checked and approved by the attending provider.

## 2019-03-26 NOTE — Progress Notes (Signed)
Location of Breast Cancer:multifocal lobular carcinoma in situ right breast.   Histology per Pathology Report:Breast, lumpectomy, Right w/seed x2 - DUCTAL CARCINOMA IN SITU, INTERMEDIATE NUCLEAR GRADE, FOCALLY INVOLVING SMALL INTRADUCTAL PAPILLOMA. SEE NOTE - MARGINS ARE NEGATIVE FOR DCIS; CLOSEST ARE SUPERIOR AND INFERIOR MARGINS AT 0.3 CM - NO EVIDENCE OF INVASIVE CARCINOMA  Receptor Status: ER(100), PR (50), Her2-neu (N/A), Ki-(N/A)  Did patient present with symptoms (if so, please note symptoms) or was this found on screening mammography?: screening  Past/Anticipated interventions by surgeon, if SXW:DOSQI BREAST LUMPECTOMY X 2  WITH RIGHT RADIOACTIVE SEED LOCALIZATION X 2 (Right) as a surgical intervention  Past/Anticipated interventions by medical oncology, if any: none yet  Lymphedema issues, if any:  no  Pain issues, if any:  no  SAFETY ISSUES:  Prior radiation?no  Pacemaker/ICD? no  Possible current pregnancy?no  Is the patient on methotrexate? no  Current Complaints / other details:  First consult for DCIS denies any issues or complaints of at this. Surgery was last Tuesday 03/18/19.    Ross Marcus, RN 03/26/2019,8:27 AM

## 2019-03-27 ENCOUNTER — Telehealth: Payer: Self-pay | Admitting: Hematology and Oncology

## 2019-03-27 NOTE — Telephone Encounter (Signed)
Received a new patient referral from Dr. Dalbert Batman fro new dx of breast cancer. Pt has been cld and scheduled to see Dr. Lindi Adie on 8/11 at 3:15pm. Ms. Melby is aware to arrive 15 minutes early.

## 2019-03-31 NOTE — Progress Notes (Signed)
[facility] CONSULT NOTE  Patient Care Team: [name], MD as PCP - General (Family Medicine)  CHIEF COMPLAINTS/PURPOSE OF CONSULTATION:  Newly diagnosed breast cancer  HISTORY OF PRESENTING ILLNESS:  [name] 68 y.o. female is here because of recent diagnosis of lobular carcinoma in situ of the right breast. The cancer was detected on a routine screening mammogram on 01/30/19. Diagnostic mammogram on 02/11/19 showed two indeterminate groups of calcifications in the upper right breast measuring 69mm and 50mm. Biopsy on 02/19/19 showed lobular carcinoma in situ with calcifications. She underwent a lumpectomy on 03/18/19 with Dr. Dalbert Batman for which pathology confirmed ductal carcinoma in situ, intermediate grade, focally involving a small intraductal papilloma, clear margins, no invasive carcinoma, ER 100%, PR 50%. She presents to the clinic today for initial evaluation and discussion of treatment options.   I reviewed her records extensively and collaborated the history with the patient.  SUMMARY OF ONCOLOGIC HISTORY: Oncology History  Ductal carcinoma in situ (DCIS) of right breast  03/18/2019 Initial Diagnosis   Routine screening mammogram detected two indeterminate groups of calcifications in the upper right breast measuring 23mm and 93mm. Biopsy showed LCIS with calcifications.    03/18/2019 Surgery   Right lumpectomy Dalbert Batman): DCIS, intermediate grade, focally involving a small intraductal papilloma, clear margins, no invasive carcinoma, ER+ 100%, PR+ 50%.      MEDICAL HISTORY:  Past Medical History:  Diagnosis Date  . Diabetes mellitus without complication ([place])   . Hyperlipidemia   . Hypertension   . Lobular carcinoma in situ (LCIS) of right breast 03/18/2019  . Obesity   . Stasis dermatitis   . Venous insufficiency     SURGICAL HISTORY: Past Surgical History:  Procedure Laterality Date  . ABDOMINAL HYSTERECTOMY    . BREAST LUMPECTOMY WITH  RADIOACTIVE SEED LOCALIZATION Right 03/18/2019   Procedure: RIGHT BREAST LUMPECTOMY X 2  WITH RIGHT RADIOACTIVE SEED LOCALIZATION X 2;  Surgeon: Fanny Skates, MD;  Location: [facility];  Service: General;  Laterality: Right;  . CHOLECYSTECTOMY    . COLONOSCOPY N/A 07/02/2013   Procedure: COLONOSCOPY;  Surgeon: Danie Binder, MD;  [facility];  Service: Endoscopy;  Laterality: N/A;  9:15 AM    SOCIAL HISTORY: Social History   Socioeconomic History  . Marital status: Married    Spouse name: Not on file  . Number of children: Not on file  . Years of education: Not on file  . Highest education level: Not on file  Occupational History  . Not on file  Social Needs  . Financial resource strain: Not on file  . Food insecurity    Worry: Not on file    Inability: Not on file  . Transportation needs    Medical: Not on file    Non-medical: Not on file  Tobacco Use  . Smoking status: Never Smoker  . Smokeless tobacco: Never Used  Substance and Sexual Activity  . Alcohol use: No  . Drug use: No  . Sexual activity: Not on file  Lifestyle  . Physical activity    Days per week: Not on file    Minutes per session: Not on file  . Stress: Not on file  Relationships  . Social Herbalist on phone: Not on file    Gets together: Not on file    Attends religious service: Not on file    Active member of club or organization: Not on file    Attends  meetings of clubs or organizations: Not on file    Relationship status: Not on file  . Intimate partner violence    Fear of current or ex partner: Not on file    Emotionally abused: Not on file    Physically abused: Not on file    Forced sexual activity: Not on file  Other Topics Concern  . Not on file  Social History Narrative  . Not on file    FAMILY HISTORY: Family History  Problem Relation Age of Onset  . Diabetes Mother   . Heart disease Mother   . Hypertension Mother   . Cancer Father   .  Colon cancer Neg Hx     ALLERGIES:  has No Known Allergies.  MEDICATIONS:  Current Outpatient Medications  Medication Sig Dispense Refill  . aspirin 81 MG tablet Take 81 mg by mouth daily.    . metFORMIN (GLUCOPHAGE) 500 MG tablet Take by mouth 2 (two) times daily with a meal.    . pravastatin (PRAVACHOL) 40 MG tablet Take 40 mg by mouth daily.    Marland Kitchen spironolactone-hydrochlorothiazide (ALDACTAZIDE) 25-25 MG per tablet Take 1 tablet by mouth daily.     . traMADol (ULTRAM) 50 MG tablet Take 50 mg by mouth every 8 (eight) hours as needed for pain.     No current facility-administered medications for this visit.     REVIEW OF SYSTEMS:   Constitutional: Denies fevers, chills or abnormal night sweats Eyes: Denies blurriness of vision, double vision or watery eyes Ears, nose, mouth, throat, and face: Denies mucositis or sore throat Respiratory: Denies cough, dyspnea or wheezes Cardiovascular: Denies palpitation, chest discomfort or lower extremity swelling Gastrointestinal:  Denies nausea, heartburn or change in bowel habits Skin: Denies abnormal skin rashes Lymphatics: Denies new lymphadenopathy or easy bruising Neurological:Denies numbness, tingling or new weaknesses Behavioral/Psych: Mood is stable, no new changes  Breast: s/p right lumpectomy Extremities: Chronic right leg swelling All other systems were reviewed with the patient and are negative.  PHYSICAL EXAMINATION: ECOG PERFORMANCE STATUS: 1 - Symptomatic but completely ambulatory  Vitals:   04/01/19 1522  BP: 135/66  Pulse: 80  Resp: 19  Temp: 97.7 F (36.5 C)  SpO2: 97%   Filed Weights   04/01/19 1522  Weight: 238 lb 11.2 oz (108.3 kg)    GENERAL:alert, no distress and comfortable SKIN: skin color, texture, turgor are normal, no rashes or significant lesions EYES: normal, conjunctiva are pink and non-injected, sclera clear OROPHARYNX:no exudate, no erythema and lips, buccal mucosa, and tongue normal  NECK:  supple, thyroid normal size, non-tender, without nodularity LYMPH:  no palpable lymphadenopathy in the cervical, axillary or inguinal LUNGS: clear to auscultation and percussion with normal breathing effort HEART: regular rate & rhythm and no murmurs and no lower extremity edema ABDOMEN:abdomen soft, non-tender and normal bowel sounds Musculoskeletal:no cyanosis of digits and no clubbing  PSYCH: alert & oriented x 3 with fluent speech NEURO: no focal motor/sensory deficits   LABORATORY DATA:  I have reviewed the data as listed Lab Results  Component Value Date   WBC 6.2 03/14/2019   HGB 12.3 03/14/2019   HCT 37.9 03/14/2019   MCV 90.0 03/14/2019   PLT 206 03/14/2019   Lab Results  Component Value Date   NA 142 03/14/2019   K 3.5 03/14/2019   CL 101 03/14/2019   CO2 29 03/14/2019    RADIOGRAPHIC STUDIES: I have personally reviewed the radiological reports and agreed with the findings in the report.  ASSESSMENT AND PLAN:  Ductal carcinoma in situ (DCIS) of right breast 03/18/2019: Screening mammogram detected indeterminate groups of calcifications 1.5 cm and 0.8 cm biopsy initially showed LCIS.   Right lumpectomy Dalbert Batman): DCIS, intermediate grade, focally involving a small intraductal papilloma, clear margins, no invasive carcinoma, ER+ 100%, PR+ 50%.   Pathology review: I discussed with the patient the difference between DCIS and invasive breast cancer. It is considered a precancerous lesion. DCIS is classified as a 0. It is generally detected through mammograms as calcifications. We discussed the significance of grades and its impact on prognosis. We also discussed the importance of ER and PR receptors and their implications to adjuvant treatment options. Prognosis of DCIS dependence on grade, comedo necrosis. It is anticipated that if not treated, 20-30% of DCIS can develop into invasive breast cancer.  Recommendation: 1.Adjuvant radiation therapy 2. Followed by antiestrogen  therapy with tamoxifen 5 years  Tamoxifen counseling: We discussed the risks and benefits of tamoxifen. These include but not limited to insomnia, hot flashes, mood changes, vaginal dryness, and weight gain. Although rare, serious side effects including endometrial cancer, risk of blood clots were also discussed. We strongly believe that the benefits far outweigh the risks. Patient understands these risks and consented to starting treatment. Planned treatment duration is 5 years.  Return to clinic after radiation to start tamoxifen.     All questions were answered. The patient knows to call the clinic with any problems, questions or concerns.   Rulon Eisenmenger, MD 04/01/2019    I, Molly Dorshimer, am acting as scribe for Nicholas Lose, MD.  I have reviewed the above documentation for accuracy and completeness, and I agree with the above.

## 2019-04-01 ENCOUNTER — Other Ambulatory Visit: Payer: Self-pay

## 2019-04-01 ENCOUNTER — Inpatient Hospital Stay: Payer: Medicare Other | Attending: Hematology and Oncology | Admitting: Hematology and Oncology

## 2019-04-01 ENCOUNTER — Encounter: Payer: Self-pay | Admitting: *Deleted

## 2019-04-01 DIAGNOSIS — D0511 Intraductal carcinoma in situ of right breast: Secondary | ICD-10-CM | POA: Diagnosis not present

## 2019-04-01 DIAGNOSIS — Z79899 Other long term (current) drug therapy: Secondary | ICD-10-CM | POA: Insufficient documentation

## 2019-04-01 DIAGNOSIS — E119 Type 2 diabetes mellitus without complications: Secondary | ICD-10-CM | POA: Diagnosis not present

## 2019-04-01 DIAGNOSIS — Z7982 Long term (current) use of aspirin: Secondary | ICD-10-CM | POA: Diagnosis not present

## 2019-04-01 DIAGNOSIS — E785 Hyperlipidemia, unspecified: Secondary | ICD-10-CM | POA: Diagnosis not present

## 2019-04-01 DIAGNOSIS — Z833 Family history of diabetes mellitus: Secondary | ICD-10-CM | POA: Diagnosis not present

## 2019-04-01 DIAGNOSIS — Z7984 Long term (current) use of oral hypoglycemic drugs: Secondary | ICD-10-CM | POA: Diagnosis not present

## 2019-04-01 DIAGNOSIS — I10 Essential (primary) hypertension: Secondary | ICD-10-CM | POA: Diagnosis not present

## 2019-04-01 DIAGNOSIS — Z8249 Family history of ischemic heart disease and other diseases of the circulatory system: Secondary | ICD-10-CM | POA: Insufficient documentation

## 2019-04-01 NOTE — Assessment & Plan Note (Signed)
03/18/2019: Screening mammogram detected indeterminate groups of calcifications 1.5 cm and 0.8 cm biopsy initially showed LCIS.   Right lumpectomy Dalbert Batman): DCIS, intermediate grade, focally involving a small intraductal papilloma, clear margins, no invasive carcinoma, ER+ 100%, PR+ 50%.   Pathology review: I discussed with the patient the difference between DCIS and invasive breast cancer. It is considered a precancerous lesion. DCIS is classified as a 0. It is generally detected through mammograms as calcifications. We discussed the significance of grades and its impact on prognosis. We also discussed the importance of ER and PR receptors and their implications to adjuvant treatment options. Prognosis of DCIS dependence on grade, comedo necrosis. It is anticipated that if not treated, 20-30% of DCIS can develop into invasive breast cancer.  Recommendation: 1.Adjuvant radiation therapy 2. Followed by antiestrogen therapy with tamoxifen 5 years  Tamoxifen counseling: We discussed the risks and benefits of tamoxifen. These include but not limited to insomnia, hot flashes, mood changes, vaginal dryness, and weight gain. Although rare, serious side effects including endometrial cancer, risk of blood clots were also discussed. We strongly believe that the benefits far outweigh the risks. Patient understands these risks and consented to starting treatment. Planned treatment duration is 5 years.  Return to clinic after radiation to start tamoxifen.

## 2019-04-22 ENCOUNTER — Ambulatory Visit: Payer: Medicare Other | Admitting: Radiation Oncology

## 2019-04-22 NOTE — Progress Notes (Signed)
  Radiation Oncology         [phone] ________________________________  Name: [name] MRN: [id number]  Date: 04/23/2019  DOB: November 22, 1950  SIMULATION AND TREATMENT PLANNING NOTE    ICD-10-CM   1. Ductal carcinoma in situ (DCIS) of right breast  D05.11     DIAGNOSIS:  Stage 0 (pTis, pNX, cMX) Right Breast  Ductal Carcinoma In Situ, ER+ / PR+, Grade 2  NARRATIVE:  The patient was brought to the [facility].  Identity was confirmed.  All relevant records and images related to the planned course of therapy were reviewed.  The patient freely provided informed written consent to proceed with treatment after reviewing the details related to the planned course of therapy. The consent form was witnessed and verified by the simulation staff.  Then, the patient was set-up in a stable reproducible  supine position for radiation therapy.  CT images were obtained.  Surface markings were placed.  The CT images were loaded into the planning software.  Then the target and avoidance structures were contoured.  Treatment planning then occurred.  The radiation prescription was entered and confirmed.  Then, I designed and supervised the construction of a total of 5 medically necessary complex treatment devices.  I have requested : 3D Simulation  I have requested a DVH of the following structures: Heart, lungs, lumpectomy cavity.  I have ordered:dose calc.  PLAN:  The patient will receive 40.05 Gy in 15 fractions followed by a boost to the lumpectomy cavity of 10 Gy in 5 fractions.     Optical Surface Tracking Plan:  Since intensity modulated radiotherapy (IMRT) and 3D conformal radiation treatment methods are predicated on accurate and precise positioning for treatment, intrafraction motion monitoring is medically necessary to ensure accurate and safe treatment delivery.  The ability to quantify intrafraction motion without excessive ionizing radiation dose can only be performed  with optical surface tracking. Accordingly, surface imaging offers the opportunity to obtain 3D measurements of patient position throughout IMRT and 3D treatments without excessive radiation exposure.  I am ordering optical surface tracking for this patient's upcoming course of radiotherapy. ________________________________    -----------------------------------  Blair Promise, PhD, MD  This document serves as a record of services personally performed by Gery Pray, MD. It was created on his behalf by Wilburn Mylar, a trained medical scribe. The creation of this record is based on the scribe's personal observations and the provider's statements to them. This document has been checked and approved by the attending provider.

## 2019-04-23 ENCOUNTER — Ambulatory Visit
Admission: RE | Admit: 2019-04-23 | Discharge: 2019-04-23 | Disposition: A | Discharge status: Home / Self Care | Payer: Medicare Other | Source: Ambulatory Visit | Attending: Radiation Oncology | Admitting: Radiation Oncology

## 2019-04-23 ENCOUNTER — Other Ambulatory Visit: Payer: Self-pay

## 2019-04-23 DIAGNOSIS — Z170 Estrogen receptor positive status [ER+]: Secondary | ICD-10-CM | POA: Insufficient documentation

## 2019-04-23 DIAGNOSIS — Z510 Encounter for antineoplastic radiation therapy: Secondary | ICD-10-CM | POA: Insufficient documentation

## 2019-04-23 DIAGNOSIS — D0511 Intraductal carcinoma in situ of right breast: Secondary | ICD-10-CM | POA: Insufficient documentation

## 2019-04-25 ENCOUNTER — Telehealth: Payer: Self-pay | Admitting: Hematology and Oncology

## 2019-04-25 ENCOUNTER — Encounter: Payer: Self-pay | Admitting: *Deleted

## 2019-04-25 NOTE — Telephone Encounter (Signed)
Scheduled apt per 9/4 sch message - mailed appt with date and time

## 2019-04-29 DIAGNOSIS — Z510 Encounter for antineoplastic radiation therapy: Secondary | ICD-10-CM | POA: Diagnosis not present

## 2019-04-29 DIAGNOSIS — Z170 Estrogen receptor positive status [ER+]: Secondary | ICD-10-CM | POA: Diagnosis not present

## 2019-04-29 DIAGNOSIS — D0511 Intraductal carcinoma in situ of right breast: Secondary | ICD-10-CM | POA: Diagnosis not present

## 2019-04-30 ENCOUNTER — Other Ambulatory Visit: Payer: Self-pay

## 2019-04-30 ENCOUNTER — Ambulatory Visit
Admission: RE | Admit: 2019-04-30 | Discharge: 2019-04-30 | Disposition: A | Discharge status: Home / Self Care | Payer: Medicare Other | Source: Ambulatory Visit | Attending: Radiation Oncology | Admitting: Radiation Oncology

## 2019-04-30 DIAGNOSIS — Z170 Estrogen receptor positive status [ER+]: Secondary | ICD-10-CM | POA: Diagnosis not present

## 2019-04-30 DIAGNOSIS — D0511 Intraductal carcinoma in situ of right breast: Secondary | ICD-10-CM | POA: Diagnosis not present

## 2019-04-30 DIAGNOSIS — Z510 Encounter for antineoplastic radiation therapy: Secondary | ICD-10-CM | POA: Diagnosis not present

## 2019-04-30 NOTE — Progress Notes (Signed)
  Radiation Oncology         [phone] ________________________________  Name: [name] MRN: [id number]  Date: 04/30/2019  DOB: 1951-06-11  Simulation Verification Note    ICD-10-CM   1. Ductal carcinoma in situ (DCIS) of right breast  D05.11     Status: outpatient  NARRATIVE: The patient was brought to the treatment unit and placed in the planned treatment position. The clinical setup was verified. Then port films were obtained and uploaded to the radiation oncology medical record software.  The treatment beams were carefully compared against the planned radiation fields. The position location and shape of the radiation fields was reviewed. They targeted volume of tissue appears to be appropriately covered by the radiation beams. Organs at risk appear to be excluded as planned.  Based on my personal review, I approved the simulation verification. The patient's treatment will proceed as planned.  -----------------------------------  Blair Promise, PhD, MD  This document serves as a record of services personally performed by Gery Pray, MD. It was created on his behalf by Wilburn Mylar, a trained medical scribe. The creation of this record is based on the scribe's personal observations and the provider's statements to them. This document has been checked and approved by the attending provider.

## 2019-05-01 ENCOUNTER — Ambulatory Visit
Admission: RE | Admit: 2019-05-01 | Discharge: 2019-05-01 | Disposition: A | Discharge status: Home / Self Care | Payer: Medicare Other | Source: Ambulatory Visit | Attending: Radiation Oncology | Admitting: Radiation Oncology

## 2019-05-01 ENCOUNTER — Other Ambulatory Visit: Payer: Self-pay

## 2019-05-01 DIAGNOSIS — Z170 Estrogen receptor positive status [ER+]: Secondary | ICD-10-CM | POA: Diagnosis not present

## 2019-05-01 DIAGNOSIS — D0511 Intraductal carcinoma in situ of right breast: Secondary | ICD-10-CM | POA: Diagnosis not present

## 2019-05-01 DIAGNOSIS — Z510 Encounter for antineoplastic radiation therapy: Secondary | ICD-10-CM | POA: Diagnosis not present

## 2019-05-02 ENCOUNTER — Ambulatory Visit
Admission: RE | Admit: 2019-05-02 | Discharge: 2019-05-02 | Disposition: A | Discharge status: Home / Self Care | Payer: Medicare Other | Source: Ambulatory Visit | Attending: Radiation Oncology | Admitting: Radiation Oncology

## 2019-05-02 ENCOUNTER — Other Ambulatory Visit: Payer: Self-pay

## 2019-05-02 DIAGNOSIS — Z170 Estrogen receptor positive status [ER+]: Secondary | ICD-10-CM | POA: Diagnosis not present

## 2019-05-02 DIAGNOSIS — Z510 Encounter for antineoplastic radiation therapy: Secondary | ICD-10-CM | POA: Diagnosis not present

## 2019-05-02 DIAGNOSIS — D0511 Intraductal carcinoma in situ of right breast: Secondary | ICD-10-CM | POA: Diagnosis not present

## 2019-05-05 ENCOUNTER — Ambulatory Visit
Admission: RE | Admit: 2019-05-05 | Discharge: 2019-05-05 | Disposition: A | Discharge status: Home / Self Care | Payer: Medicare Other | Source: Ambulatory Visit | Attending: Radiation Oncology | Admitting: Radiation Oncology

## 2019-05-05 ENCOUNTER — Other Ambulatory Visit: Payer: Self-pay

## 2019-05-05 DIAGNOSIS — Z170 Estrogen receptor positive status [ER+]: Secondary | ICD-10-CM | POA: Diagnosis not present

## 2019-05-05 DIAGNOSIS — D0511 Intraductal carcinoma in situ of right breast: Secondary | ICD-10-CM | POA: Diagnosis not present

## 2019-05-05 DIAGNOSIS — Z510 Encounter for antineoplastic radiation therapy: Secondary | ICD-10-CM | POA: Diagnosis not present

## 2019-05-06 ENCOUNTER — Ambulatory Visit
Admission: RE | Admit: 2019-05-06 | Discharge: 2019-05-06 | Disposition: A | Discharge status: Home / Self Care | Payer: Medicare Other | Source: Ambulatory Visit | Attending: Radiation Oncology | Admitting: Radiation Oncology

## 2019-05-06 ENCOUNTER — Other Ambulatory Visit: Payer: Self-pay

## 2019-05-06 DIAGNOSIS — Z510 Encounter for antineoplastic radiation therapy: Secondary | ICD-10-CM | POA: Diagnosis not present

## 2019-05-06 DIAGNOSIS — D0511 Intraductal carcinoma in situ of right breast: Secondary | ICD-10-CM

## 2019-05-06 DIAGNOSIS — Z170 Estrogen receptor positive status [ER+]: Secondary | ICD-10-CM | POA: Diagnosis not present

## 2019-05-06 MED ORDER — ALRA NON-METALLIC DEODORANT (RAD-ONC)
1.0000 "application " | Freq: Once | TOPICAL | Status: AC
  Administered 2019-05-06: 1 via TOPICAL

## 2019-05-06 MED ORDER — RADIAPLEXRX EX GEL
Freq: Once | CUTANEOUS | Status: AC
  Administered 2019-05-06: 16:00:00 via TOPICAL

## 2019-05-07 ENCOUNTER — Ambulatory Visit
Admission: RE | Admit: 2019-05-07 | Discharge: 2019-05-07 | Disposition: A | Discharge status: Home / Self Care | Payer: Medicare Other | Source: Ambulatory Visit | Attending: Radiation Oncology | Admitting: Radiation Oncology

## 2019-05-07 ENCOUNTER — Other Ambulatory Visit: Payer: Self-pay

## 2019-05-07 DIAGNOSIS — D0511 Intraductal carcinoma in situ of right breast: Secondary | ICD-10-CM | POA: Diagnosis not present

## 2019-05-07 DIAGNOSIS — Z510 Encounter for antineoplastic radiation therapy: Secondary | ICD-10-CM | POA: Diagnosis not present

## 2019-05-07 DIAGNOSIS — Z170 Estrogen receptor positive status [ER+]: Secondary | ICD-10-CM | POA: Diagnosis not present

## 2019-05-08 ENCOUNTER — Other Ambulatory Visit: Payer: Self-pay

## 2019-05-08 ENCOUNTER — Ambulatory Visit
Admission: RE | Admit: 2019-05-08 | Discharge: 2019-05-08 | Disposition: A | Discharge status: Home / Self Care | Payer: Medicare Other | Source: Ambulatory Visit | Attending: Radiation Oncology | Admitting: Radiation Oncology

## 2019-05-08 DIAGNOSIS — Z170 Estrogen receptor positive status [ER+]: Secondary | ICD-10-CM | POA: Diagnosis not present

## 2019-05-08 DIAGNOSIS — D0511 Intraductal carcinoma in situ of right breast: Secondary | ICD-10-CM | POA: Diagnosis not present

## 2019-05-08 DIAGNOSIS — Z510 Encounter for antineoplastic radiation therapy: Secondary | ICD-10-CM | POA: Diagnosis not present

## 2019-05-09 ENCOUNTER — Other Ambulatory Visit: Payer: Self-pay

## 2019-05-09 ENCOUNTER — Ambulatory Visit
Admission: RE | Admit: 2019-05-09 | Discharge: 2019-05-09 | Disposition: A | Discharge status: Home / Self Care | Payer: Medicare Other | Source: Ambulatory Visit | Attending: Radiation Oncology | Admitting: Radiation Oncology

## 2019-05-09 DIAGNOSIS — Z510 Encounter for antineoplastic radiation therapy: Secondary | ICD-10-CM | POA: Diagnosis not present

## 2019-05-09 DIAGNOSIS — D0511 Intraductal carcinoma in situ of right breast: Secondary | ICD-10-CM | POA: Diagnosis not present

## 2019-05-09 DIAGNOSIS — Z170 Estrogen receptor positive status [ER+]: Secondary | ICD-10-CM | POA: Diagnosis not present

## 2019-05-12 ENCOUNTER — Other Ambulatory Visit: Payer: Self-pay

## 2019-05-12 ENCOUNTER — Ambulatory Visit
Admission: RE | Admit: 2019-05-12 | Discharge: 2019-05-12 | Disposition: A | Discharge status: Home / Self Care | Payer: Medicare Other | Source: Ambulatory Visit | Attending: Radiation Oncology | Admitting: Radiation Oncology

## 2019-05-12 DIAGNOSIS — Z170 Estrogen receptor positive status [ER+]: Secondary | ICD-10-CM | POA: Diagnosis not present

## 2019-05-12 DIAGNOSIS — D0511 Intraductal carcinoma in situ of right breast: Secondary | ICD-10-CM | POA: Diagnosis not present

## 2019-05-12 DIAGNOSIS — Z510 Encounter for antineoplastic radiation therapy: Secondary | ICD-10-CM | POA: Diagnosis not present

## 2019-05-13 ENCOUNTER — Ambulatory Visit
Admission: RE | Admit: 2019-05-13 | Discharge: 2019-05-13 | Disposition: A | Discharge status: Home / Self Care | Payer: Medicare Other | Source: Ambulatory Visit | Attending: Radiation Oncology | Admitting: Radiation Oncology

## 2019-05-13 ENCOUNTER — Other Ambulatory Visit: Payer: Self-pay

## 2019-05-13 DIAGNOSIS — Z170 Estrogen receptor positive status [ER+]: Secondary | ICD-10-CM | POA: Diagnosis not present

## 2019-05-13 DIAGNOSIS — Z510 Encounter for antineoplastic radiation therapy: Secondary | ICD-10-CM | POA: Diagnosis not present

## 2019-05-13 DIAGNOSIS — D0511 Intraductal carcinoma in situ of right breast: Secondary | ICD-10-CM | POA: Diagnosis not present

## 2019-05-14 ENCOUNTER — Other Ambulatory Visit: Payer: Self-pay

## 2019-05-14 ENCOUNTER — Ambulatory Visit
Admission: RE | Admit: 2019-05-14 | Discharge: 2019-05-14 | Disposition: A | Discharge status: Home / Self Care | Payer: Medicare Other | Source: Ambulatory Visit | Attending: Radiation Oncology | Admitting: Radiation Oncology

## 2019-05-14 DIAGNOSIS — D0511 Intraductal carcinoma in situ of right breast: Secondary | ICD-10-CM | POA: Diagnosis not present

## 2019-05-14 DIAGNOSIS — Z170 Estrogen receptor positive status [ER+]: Secondary | ICD-10-CM | POA: Diagnosis not present

## 2019-05-14 DIAGNOSIS — Z510 Encounter for antineoplastic radiation therapy: Secondary | ICD-10-CM | POA: Diagnosis not present

## 2019-05-15 ENCOUNTER — Ambulatory Visit
Admission: RE | Admit: 2019-05-15 | Discharge: 2019-05-15 | Disposition: A | Discharge status: Home / Self Care | Payer: Medicare Other | Source: Ambulatory Visit | Attending: Radiation Oncology | Admitting: Radiation Oncology

## 2019-05-15 ENCOUNTER — Other Ambulatory Visit: Payer: Self-pay

## 2019-05-15 DIAGNOSIS — D0511 Intraductal carcinoma in situ of right breast: Secondary | ICD-10-CM | POA: Diagnosis not present

## 2019-05-15 DIAGNOSIS — Z510 Encounter for antineoplastic radiation therapy: Secondary | ICD-10-CM | POA: Diagnosis not present

## 2019-05-15 DIAGNOSIS — Z170 Estrogen receptor positive status [ER+]: Secondary | ICD-10-CM | POA: Diagnosis not present

## 2019-05-16 ENCOUNTER — Ambulatory Visit
Admission: RE | Admit: 2019-05-16 | Discharge: 2019-05-16 | Disposition: A | Discharge status: Home / Self Care | Payer: Medicare Other | Source: Ambulatory Visit | Attending: Radiation Oncology | Admitting: Radiation Oncology

## 2019-05-16 DIAGNOSIS — Z170 Estrogen receptor positive status [ER+]: Secondary | ICD-10-CM | POA: Diagnosis not present

## 2019-05-16 DIAGNOSIS — D0511 Intraductal carcinoma in situ of right breast: Secondary | ICD-10-CM | POA: Diagnosis not present

## 2019-05-16 DIAGNOSIS — Z510 Encounter for antineoplastic radiation therapy: Secondary | ICD-10-CM | POA: Diagnosis not present

## 2019-05-19 ENCOUNTER — Other Ambulatory Visit: Payer: Self-pay

## 2019-05-19 ENCOUNTER — Ambulatory Visit
Admission: RE | Admit: 2019-05-19 | Discharge: 2019-05-19 | Disposition: A | Discharge status: Home / Self Care | Payer: Medicare Other | Source: Ambulatory Visit | Attending: Radiation Oncology | Admitting: Radiation Oncology

## 2019-05-19 DIAGNOSIS — D0511 Intraductal carcinoma in situ of right breast: Secondary | ICD-10-CM | POA: Diagnosis not present

## 2019-05-19 DIAGNOSIS — Z170 Estrogen receptor positive status [ER+]: Secondary | ICD-10-CM | POA: Diagnosis not present

## 2019-05-19 DIAGNOSIS — Z510 Encounter for antineoplastic radiation therapy: Secondary | ICD-10-CM | POA: Diagnosis not present

## 2019-05-20 ENCOUNTER — Other Ambulatory Visit: Payer: Self-pay

## 2019-05-20 ENCOUNTER — Ambulatory Visit
Admission: RE | Admit: 2019-05-20 | Discharge: 2019-05-20 | Disposition: A | Discharge status: Home / Self Care | Payer: Medicare Other | Source: Ambulatory Visit | Attending: Radiation Oncology | Admitting: Radiation Oncology

## 2019-05-20 ENCOUNTER — Ambulatory Visit: Payer: Medicare Other | Admitting: Radiation Oncology

## 2019-05-20 DIAGNOSIS — D0511 Intraductal carcinoma in situ of right breast: Secondary | ICD-10-CM | POA: Diagnosis not present

## 2019-05-20 DIAGNOSIS — Z170 Estrogen receptor positive status [ER+]: Secondary | ICD-10-CM | POA: Diagnosis not present

## 2019-05-20 DIAGNOSIS — Z510 Encounter for antineoplastic radiation therapy: Secondary | ICD-10-CM | POA: Diagnosis not present

## 2019-05-21 ENCOUNTER — Other Ambulatory Visit: Payer: Self-pay

## 2019-05-21 ENCOUNTER — Ambulatory Visit
Admission: RE | Admit: 2019-05-21 | Discharge: 2019-05-21 | Disposition: A | Discharge status: Home / Self Care | Payer: Medicare Other | Source: Ambulatory Visit | Attending: Radiation Oncology | Admitting: Radiation Oncology

## 2019-05-21 DIAGNOSIS — D0511 Intraductal carcinoma in situ of right breast: Secondary | ICD-10-CM | POA: Diagnosis not present

## 2019-05-21 DIAGNOSIS — Z170 Estrogen receptor positive status [ER+]: Secondary | ICD-10-CM | POA: Diagnosis not present

## 2019-05-21 DIAGNOSIS — Z510 Encounter for antineoplastic radiation therapy: Secondary | ICD-10-CM | POA: Diagnosis not present

## 2019-05-22 ENCOUNTER — Other Ambulatory Visit: Payer: Self-pay

## 2019-05-22 ENCOUNTER — Ambulatory Visit
Admission: RE | Admit: 2019-05-22 | Discharge: 2019-05-22 | Disposition: A | Discharge status: Home / Self Care | Payer: Medicare Other | Source: Ambulatory Visit | Attending: Radiation Oncology | Admitting: Radiation Oncology

## 2019-05-22 DIAGNOSIS — Z510 Encounter for antineoplastic radiation therapy: Secondary | ICD-10-CM | POA: Diagnosis not present

## 2019-05-22 DIAGNOSIS — Z170 Estrogen receptor positive status [ER+]: Secondary | ICD-10-CM | POA: Diagnosis not present

## 2019-05-22 DIAGNOSIS — D0511 Intraductal carcinoma in situ of right breast: Secondary | ICD-10-CM | POA: Diagnosis not present

## 2019-05-22 NOTE — Progress Notes (Signed)
Patient Care Team: [name], MD as PCP - General (Family Medicine)  DIAGNOSIS:    ICD-10-CM   1. Ductal carcinoma in situ (DCIS) of right breast  D05.11     SUMMARY OF ONCOLOGIC HISTORY: Oncology History  Ductal carcinoma in situ (DCIS) of right breast  03/18/2019 Initial Diagnosis   Routine screening mammogram detected two indeterminate groups of calcifications in the upper right breast measuring 84mm and 76mm. Biopsy showed LCIS with calcifications.    03/18/2019 Surgery   Right lumpectomy Dalbert Batman): DCIS, intermediate grade, focally involving a small intraductal papilloma, clear margins, no invasive carcinoma, ER+ 100%, PR+ 50%.    05/01/2019 -  Radiation Therapy   Adjuvant radiation therapy     CHIEF COMPLIANT: Follow-up to discuss anti-estrogen therapy  INTERVAL HISTORY: [name] is a 68 y.o. with above-mentioned history of right breast DCIS who underwent a lumpectomy and is currently undergoing radiation. She presents to the clinic today to discuss anti-estrogen therapy.  She is tolerating radiation extremely well.  She has only mild radiation dermatitis.  REVIEW OF SYSTEMS:   Constitutional: Denies fevers, chills or abnormal weight loss Eyes: Denies blurriness of vision Ears, nose, mouth, throat, and face: Denies mucositis or sore throat Respiratory: Denies cough, dyspnea or wheezes Cardiovascular: Denies palpitation, chest discomfort Gastrointestinal: Denies nausea, heartburn or change in bowel habits Skin: Denies abnormal skin rashes Lymphatics: Denies new lymphadenopathy or easy bruising Neurological: Denies numbness, tingling or new weaknesses Behavioral/Psych: Mood is stable, no new changes  Extremities: No lower extremity edema Breast: Mild radiation dermatitis All other systems were reviewed with the patient and are negative.  I have reviewed the past medical history, past surgical history, social history and family history with the patient  and they are unchanged from previous note.  ALLERGIES:  has No Known Allergies.  MEDICATIONS:  Current Outpatient Medications  Medication Sig Dispense Refill  . aspirin 81 MG tablet Take 81 mg by mouth daily.    . metFORMIN (GLUCOPHAGE) 500 MG tablet Take by mouth 2 (two) times daily with a meal.    . pravastatin (PRAVACHOL) 40 MG tablet Take 40 mg by mouth daily.    Marland Kitchen spironolactone-hydrochlorothiazide (ALDACTAZIDE) 25-25 MG per tablet Take 1 tablet by mouth daily.     . traMADol (ULTRAM) 50 MG tablet Take 50 mg by mouth every 8 (eight) hours as needed for pain.     No current facility-administered medications for this visit.     PHYSICAL EXAMINATION: ECOG PERFORMANCE STATUS: 1 - Symptomatic but completely ambulatory  Vitals:   05/23/19 0847  BP: 132/64  Pulse: 80  Resp: 18  Temp: 98 F (36.7 C)  SpO2: 99%   Filed Weights   05/23/19 0847  Weight: 239 lb 4.8 oz (108.5 kg)    GENERAL: alert, no distress and comfortable SKIN: skin color, texture, turgor are normal, no rashes or significant lesions EYES: normal, Conjunctiva are pink and non-injected, sclera clear OROPHARYNX: no exudate, no erythema and lips, buccal mucosa, and tongue normal  NECK: supple, thyroid normal size, non-tender, without nodularity LYMPH: no palpable lymphadenopathy in the cervical, axillary or inguinal LUNGS: clear to auscultation and percussion with normal breathing effort HEART: regular rate & rhythm and no murmurs and no lower extremity edema ABDOMEN: abdomen soft, non-tender and normal bowel sounds MUSCULOSKELETAL: no cyanosis of digits and no clubbing  NEURO: alert & oriented x 3 with fluent speech, no focal motor/sensory deficits EXTREMITIES: No lower extremity edema  LABORATORY DATA:  I have  reviewed the data as listed CMP Latest Ref Rng & Units 03/14/2019  Glucose 70 - 99 mg/dL 106(H)  BUN 8 - 23 mg/dL 23  Creatinine 0.44 - 1.00 mg/dL 1.34(H)  Sodium 135 - 145 mmol/L 142  Potassium  3.5 - 5.1 mmol/L 3.5  Chloride 98 - 111 mmol/L 101  CO2 22 - 32 mmol/L 29  Calcium 8.9 - 10.3 mg/dL 9.8  Total Protein 6.5 - 8.1 g/dL 7.4  Total Bilirubin 0.3 - 1.2 mg/dL 0.6  Alkaline Phos 38 - 126 U/L 71  AST 15 - 41 U/L 19  ALT 0 - 44 U/L 17    Lab Results  Component Value Date   WBC 6.2 03/14/2019   HGB 12.3 03/14/2019   HCT 37.9 03/14/2019   MCV 90.0 03/14/2019   PLT 206 03/14/2019   NEUTROABS 4.1 03/14/2019    ASSESSMENT & PLAN:  Ductal carcinoma in situ (DCIS) of right breast 03/18/2019: Screening mammogram detected indeterminate groups of calcifications 1.5 cm and 0.8 cm biopsy initially showed LCIS.   Right lumpectomy Dalbert Batman): DCIS, intermediate grade, focally involving a small intraductal papilloma, clear margins, no invasive carcinoma, ER+ 100%, PR+ 50 Adjuvant radiation 05/01/2019-05/23/2019  Treatment plan: Tamoxifen 20 mg daily x5 years We discussed the pros and cons of tamoxifen therapy once again.  Return to clinic in 3 months for survivorship care plan visit    No orders of the defined types were placed in this encounter.  The patient has a good understanding of the overall plan. she agrees with it. she will call with any problems that may develop before the next visit here.  Nicholas Lose, MD 05/23/2019  Julious Oka Dorshimer am acting as scribe for Dr. Nicholas Lose.  I have reviewed the above documentation for accuracy and completeness, and I agree with the above.

## 2019-05-23 ENCOUNTER — Other Ambulatory Visit: Payer: Self-pay

## 2019-05-23 ENCOUNTER — Inpatient Hospital Stay: Payer: Medicare Other | Attending: Hematology and Oncology | Admitting: Hematology and Oncology

## 2019-05-23 ENCOUNTER — Ambulatory Visit
Admission: RE | Admit: 2019-05-23 | Discharge: 2019-05-23 | Disposition: A | Discharge status: Home / Self Care | Payer: Medicare Other | Source: Ambulatory Visit | Attending: Radiation Oncology | Admitting: Radiation Oncology

## 2019-05-23 DIAGNOSIS — Z170 Estrogen receptor positive status [ER+]: Secondary | ICD-10-CM | POA: Diagnosis not present

## 2019-05-23 DIAGNOSIS — Z7984 Long term (current) use of oral hypoglycemic drugs: Secondary | ICD-10-CM | POA: Insufficient documentation

## 2019-05-23 DIAGNOSIS — Z79899 Other long term (current) drug therapy: Secondary | ICD-10-CM | POA: Diagnosis not present

## 2019-05-23 DIAGNOSIS — D0511 Intraductal carcinoma in situ of right breast: Secondary | ICD-10-CM | POA: Diagnosis not present

## 2019-05-23 DIAGNOSIS — E119 Type 2 diabetes mellitus without complications: Secondary | ICD-10-CM | POA: Diagnosis not present

## 2019-05-23 DIAGNOSIS — Z510 Encounter for antineoplastic radiation therapy: Secondary | ICD-10-CM | POA: Diagnosis not present

## 2019-05-23 DIAGNOSIS — Z7982 Long term (current) use of aspirin: Secondary | ICD-10-CM | POA: Insufficient documentation

## 2019-05-23 MED ORDER — TAMOXIFEN CITRATE 20 MG PO TABS: 20.0000 mg | ORAL_TABLET | Freq: Every day | ORAL | 3 refills | Status: DC

## 2019-05-23 MED ORDER — METFORMIN HCL 500 MG PO TABS: 500.0000 mg | ORAL_TABLET | Freq: Every day | ORAL | Status: AC

## 2019-05-23 NOTE — Assessment & Plan Note (Signed)
03/18/2019: Screening mammogram detected indeterminate groups of calcifications 1.5 cm and 0.8 cm biopsy initially showed LCIS.   Right lumpectomy Dalbert Batman): DCIS, intermediate grade, focally involving a small intraductal papilloma, clear margins, no invasive carcinoma, ER+ 100%, PR+ 50 Adjuvant radiation 05/01/2019-05/23/2019  Treatment plan: Tamoxifen 20 mg daily x5 years We discussed the pros and cons of tamoxifen therapy once again.  Return to clinic in 3 months for survivorship care plan visit

## 2019-05-26 ENCOUNTER — Telehealth: Payer: Self-pay | Admitting: Hematology and Oncology

## 2019-05-26 ENCOUNTER — Ambulatory Visit
Admission: RE | Admit: 2019-05-26 | Discharge: 2019-05-26 | Disposition: A | Discharge status: Home / Self Care | Payer: Medicare Other | Source: Ambulatory Visit | Attending: Radiation Oncology | Admitting: Radiation Oncology

## 2019-05-26 ENCOUNTER — Other Ambulatory Visit: Payer: Self-pay

## 2019-05-26 DIAGNOSIS — D0511 Intraductal carcinoma in situ of right breast: Secondary | ICD-10-CM | POA: Diagnosis not present

## 2019-05-26 DIAGNOSIS — Z170 Estrogen receptor positive status [ER+]: Secondary | ICD-10-CM | POA: Diagnosis not present

## 2019-05-26 DIAGNOSIS — Z510 Encounter for antineoplastic radiation therapy: Secondary | ICD-10-CM | POA: Diagnosis not present

## 2019-05-26 NOTE — Telephone Encounter (Signed)
I talk with patient regarding schedule  

## 2019-05-27 ENCOUNTER — Other Ambulatory Visit: Payer: Self-pay

## 2019-05-27 ENCOUNTER — Ambulatory Visit
Admission: RE | Admit: 2019-05-27 | Discharge: 2019-05-27 | Disposition: A | Discharge status: Home / Self Care | Payer: Medicare Other | Source: Ambulatory Visit | Attending: Radiation Oncology | Admitting: Radiation Oncology

## 2019-05-27 ENCOUNTER — Encounter: Payer: Self-pay | Admitting: Radiation Oncology

## 2019-05-27 DIAGNOSIS — Z510 Encounter for antineoplastic radiation therapy: Secondary | ICD-10-CM | POA: Diagnosis not present

## 2019-05-27 DIAGNOSIS — D0511 Intraductal carcinoma in situ of right breast: Secondary | ICD-10-CM | POA: Diagnosis not present

## 2019-05-27 DIAGNOSIS — Z170 Estrogen receptor positive status [ER+]: Secondary | ICD-10-CM | POA: Diagnosis not present

## 2019-05-28 ENCOUNTER — Ambulatory Visit: Payer: Medicare Other

## 2019-05-29 ENCOUNTER — Ambulatory Visit: Payer: Medicare Other

## 2019-05-29 ENCOUNTER — Encounter: Payer: Self-pay | Admitting: *Deleted

## 2019-05-30 ENCOUNTER — Ambulatory Visit: Payer: Medicare Other

## 2019-06-02 ENCOUNTER — Ambulatory Visit: Payer: Medicare Other

## 2019-06-03 ENCOUNTER — Ambulatory Visit: Payer: Medicare Other

## 2019-06-04 ENCOUNTER — Ambulatory Visit: Payer: Medicare Other

## 2019-06-05 ENCOUNTER — Ambulatory Visit: Payer: Medicare Other

## 2019-06-06 ENCOUNTER — Ambulatory Visit: Payer: Medicare Other

## 2019-06-09 ENCOUNTER — Ambulatory Visit: Payer: Medicare Other

## 2019-06-10 ENCOUNTER — Ambulatory Visit: Payer: Medicare Other

## 2019-06-11 ENCOUNTER — Ambulatory Visit: Payer: Medicare Other

## 2019-06-12 ENCOUNTER — Ambulatory Visit: Payer: Medicare Other

## 2019-06-13 ENCOUNTER — Ambulatory Visit: Payer: Medicare Other

## 2019-06-16 ENCOUNTER — Ambulatory Visit: Payer: Medicare Other

## 2019-06-17 ENCOUNTER — Other Ambulatory Visit: Payer: Self-pay

## 2019-06-17 MED ORDER — TAMOXIFEN CITRATE 20 MG PO TABS: 20.0000 mg | ORAL_TABLET | Freq: Every day | ORAL | 3 refills | Status: DC

## 2019-06-30 ENCOUNTER — Ambulatory Visit
Admission: RE | Admit: 2019-06-30 | Discharge: 2019-06-30 | Disposition: A | Discharge status: Home / Self Care | Payer: Medicare Other | Source: Ambulatory Visit | Attending: Radiation Oncology | Admitting: Radiation Oncology

## 2019-06-30 ENCOUNTER — Encounter: Payer: Self-pay | Admitting: Radiation Oncology

## 2019-06-30 ENCOUNTER — Other Ambulatory Visit: Payer: Self-pay

## 2019-06-30 VITALS — BP 129/77 | HR 75 | Temp 98.0°F | Resp 18 | Ht 65.0 in | Wt 239.1 lb

## 2019-06-30 DIAGNOSIS — Z7984 Long term (current) use of oral hypoglycemic drugs: Secondary | ICD-10-CM | POA: Diagnosis not present

## 2019-06-30 DIAGNOSIS — Z79899 Other long term (current) drug therapy: Secondary | ICD-10-CM | POA: Diagnosis not present

## 2019-06-30 DIAGNOSIS — R232 Flushing: Secondary | ICD-10-CM | POA: Diagnosis not present

## 2019-06-30 DIAGNOSIS — R5383 Other fatigue: Secondary | ICD-10-CM | POA: Diagnosis not present

## 2019-06-30 DIAGNOSIS — Z79810 Long term (current) use of selective estrogen receptor modulators (SERMs): Secondary | ICD-10-CM | POA: Diagnosis not present

## 2019-06-30 DIAGNOSIS — D0511 Intraductal carcinoma in situ of right breast: Secondary | ICD-10-CM | POA: Diagnosis not present

## 2019-06-30 DIAGNOSIS — Z170 Estrogen receptor positive status [ER+]: Secondary | ICD-10-CM | POA: Diagnosis not present

## 2019-06-30 DIAGNOSIS — Z7982 Long term (current) use of aspirin: Secondary | ICD-10-CM | POA: Insufficient documentation

## 2019-06-30 NOTE — Patient Instructions (Signed)
Coronavirus (COVID-19) Are you at risk?  Are you at risk for the Coronavirus (COVID-19)?  To be considered HIGH RISK for Coronavirus (COVID-19), you have to meet the following criteria:  . Traveled to [place], [place], [place], [place] or [place]; or in the [place] to [place], [place], [place], or [place]; and have fever, cough, and shortness of breath within the last 2 weeks of travel OR . Been in close contact with a person diagnosed with COVID-19 within the last 2 weeks and have fever, cough, and shortness of breath . IF YOU DO NOT MEET THESE CRITERIA, YOU ARE CONSIDERED LOW RISK FOR COVID-19.  What to do if you are HIGH RISK for COVID-19?  . If you are having a medical emergency, call 911. . Seek medical care right away. Before you go to a doctor's office, urgent care or emergency department, call ahead and tell them about your recent travel, contact with someone diagnosed with COVID-19, and your symptoms. You should receive instructions from your physician's office regarding next steps of care.  . When you arrive at healthcare provider, tell the healthcare staff immediately you have returned from visiting [place], [place], [place], [place] or [place]; or traveled in the [place] to [place], [place], [place], or [place]; in the last two weeks or you have been in close contact with a person diagnosed with COVID-19 in the last 2 weeks.   . Tell the health care staff about your symptoms: fever, cough and shortness of breath. . After you have been seen by a medical provider, you will be either: o Tested for (COVID-19) and discharged home on quarantine except to seek medical care if symptoms worsen, and asked to  - Stay home and avoid contact with others until you get your results (4-5 days)  - Avoid travel on public transportation if possible (such as bus, train, or airplane) or o Sent to the Emergency Department by EMS for evaluation, COVID-19 testing, and possible  admission depending on your condition and test results.  What to do if you are LOW RISK for COVID-19?  Reduce your risk of any infection by using the same precautions used for avoiding the common cold or flu:  . Wash your hands often with soap and warm water for at least 20 seconds.  If soap and water are not readily available, use an alcohol-based hand sanitizer with at least 60% alcohol.  . If coughing or sneezing, cover your mouth and nose by coughing or sneezing into the elbow areas of your shirt or coat, into a tissue or into your sleeve (not your hands). . Avoid shaking hands with others and consider head nods or verbal greetings only. . Avoid touching your eyes, nose, or mouth with unwashed hands.  . Avoid close contact with people who are sick. . Avoid places or events with large numbers of people in one location, like concerts or sporting events. . Carefully consider travel plans you have or are making. . If you are planning any travel outside or inside the [place], visit the CDC's Travelers' Health webpage for the latest health notices. . If you have some symptoms but not all symptoms, continue to monitor at home and seek medical attention if your symptoms worsen. . If you are having a medical emergency, call 911.   ADDITIONAL HEALTHCARE OPTIONS FOR PATIENTS   Telehealth / e-Visit: [email or web address]         [facility]: [phone]  [facility]   [facility]: [phone]                   [facility]: [phone]   

## 2019-06-30 NOTE — Progress Notes (Signed)
  Radiation Oncology         [phone] ________________________________  Name: [name] MRN: [id number]  Date: 06/30/2019  DOB: December 01, 1950  Follow-Up Visit Note  CC: [name], MD  [name], MD    ICD-10-CM   1. Ductal carcinoma in situ (DCIS) of right breast  D05.11     Diagnosis: Stage0(pTis, pNX, cMX)RightBreast DuctalCarcinoma In Situ, ER+/ PR+, Grade2  Interval Since Last Radiation: One month.   Narrative:  The patient returns today for routine follow-up.  She continues to have some sensitivity in the breast area particularly the nipple region.  She denies any nipple discharge or bleeding.  Patient has started on tamoxifen and is having significant problems with hot flashes related to this medication.  On review of systems, she occasional sharp shooting pains within the breast area which is slowly diminishing           ALLERGIES:  has No Known Allergies.  Meds: Current Outpatient Medications  Medication Sig Dispense Refill  . aspirin 81 MG tablet Take 81 mg by mouth daily.    . Fluocinolone Acetonide Scalp 0.01 % OIL     . metFORMIN (GLUCOPHAGE) 500 MG tablet Take 1 tablet (500 mg total) by mouth daily with breakfast.    . pravastatin (PRAVACHOL) 40 MG tablet Take 40 mg by mouth daily.    Marland Kitchen spironolactone-hydrochlorothiazide (ALDACTAZIDE) 25-25 MG per tablet Take 1 tablet by mouth daily.     . tamoxifen (NOLVADEX) 20 MG tablet Take 1 tablet (20 mg total) by mouth daily. 90 tablet 3   No current facility-administered medications for this encounter.     Physical Findings: The patient is in no acute distress. Patient is alert and oriented. Lungs are clear to auscultation bilaterally. Heart has regular rate and rhythm. No palpable cervical, supraclavicular, or axillary adenopathy. Abdomen soft, non-tender, normal bowel sounds.  Examination of the left breast reveals no mass nipple discharge or bleeding.  Examination of the right breast reveals  hyperpigmentation changes and mild edema.  Skin is well-healed.  No dominant mass appreciated in the breast nipple discharge or bleeding.  height is 5\' 5"  (1.651 m) and weight is 239 lb 2 oz (108.5 kg). Her temporal temperature is 98 F (36.7 C). Her blood pressure is 129/77 and her pulse is 75. Her respiration is 18 and oxygen saturation is 100%. .   Lab Findings: Lab Results  Component Value Date   WBC 6.2 03/14/2019   HGB 12.3 03/14/2019   HCT 37.9 03/14/2019   MCV 90.0 03/14/2019   PLT 206 03/14/2019    Radiographic Findings: No results found.  Impression:  The patient is recovering from the effects of radiation.  Fatigue and breast discomfort  significantly improved.  Plan: As needed follow-up in radiation oncology.  Patient will continue close follow-up with medical oncology will remain on tamoxifen for 5 years if she tolerates this medication.  ____________________________________ Gery Pray, MD  This document serves as a record of services personally performed by Gery Pray, MD. It was created on his behalf by Clerance Lav, a trained medical scribe. The creation of this record is based on the scribe's personal observations and the provider's statements to them. This document has been checked and approved by the attending provider.'

## 2019-06-30 NOTE — Progress Notes (Incomplete)
°  Patient Name: [name] MRN: [id number] DOB: Feb 22, 1951 Referring Physician: [name] (Profile Not Attached) Date of Service: 05/27/2019 [facility] [place]                                                        End Of Treatment Note  Diagnoses: D05.11-Intraductal carcinoma in situ of right breast  Cancer Staging: Stage 0 (pTis, pNX, cMX), ER+ / PR+, Grade 2  Intent: Curative  Radiation Treatment Dates: 04/30/2019 through 05/27/2019 Site Technique Total Dose Dose per Fx Completed Fx Beam Energies  Breast: Breast_Rt 3D 40.05/40.05 2.67 15/15 10X, 15X  Breast: Breast_Rt_Bst 3D 10/10 2 5/5 10X, 15X   Narrative: The patient tolerated radiation therapy relatively well. She denied pain and fatigue throughout treatment. She experienced minimal hyperpigmentation changes. By the end of treatments, she reported some tenderness to the breast.  Plan: The patient will follow-up with radiation oncology in 1 month.  ________________________________________________   Blair Promise, PhD, MD  This document serves as a record of services personally performed by Gery Pray, MD. It was created on his behalf by Wilburn Mylar, a trained medical scribe. The creation of this record is based on the scribe's personal observations and the provider's statements to them. This document has been checked and approved by the attending provider.

## 2019-06-30 NOTE — Progress Notes (Signed)
Pt presents today for f/u with Dr. Bobbe Medico. Pt reports that nipple is sensitive and tender to touch, intermittently. Pt with occasional pains in lateral breast. Pt reports that fatigue manifested after radiation but is improving since. Pt reports using [place] cocoa butter on breast. Breast is slightly hyperpigmented, with swelling noted in nipple.     BP 129/77 (BP Location: Left Arm, Patient Position: Sitting)   Pulse 75   Temp 98 F (36.7 C) (Temporal)   Resp 18   Ht 5\' 5"  (1.651 m)   Wt 239 lb 2 oz (108.5 kg)   SpO2 100%   BMI 39.79 kg/m   Wt Readings from Last 3 Encounters:  06/30/19 239 lb 2 oz (108.5 kg)  05/23/19 239 lb 4.8 oz (108.5 kg)  04/01/19 238 lb 11.2 oz (108.3 kg)    Loma Sousa, RN BSN

## 2019-08-26 ENCOUNTER — Telehealth: Payer: Self-pay

## 2019-08-26 ENCOUNTER — Inpatient Hospital Stay: Payer: Medicare Other | Admitting: Adult Health

## 2019-08-26 DIAGNOSIS — U071 COVID-19: Secondary | ICD-10-CM | POA: Diagnosis not present

## 2019-08-26 NOTE — Telephone Encounter (Signed)
Pt called to cancel appointment for tomorrow stated she had two deaths in the family. Brother and Mother. Expressed my deepest Condolences to her and her family and informed her to call back to reschedule appointment when she can. Mendel Ryder informed

## 2019-09-15 ENCOUNTER — Telehealth: Payer: Self-pay | Admitting: Adult Health

## 2019-09-15 NOTE — Telephone Encounter (Signed)
Scheduled per 1/22 sch msg. Called and spoke with pt, confirmed 2/26 appt

## 2019-10-13 ENCOUNTER — Ambulatory Visit: Payer: Medicare Other | Attending: Family

## 2019-10-13 DIAGNOSIS — Z23 Encounter for immunization: Secondary | ICD-10-CM | POA: Insufficient documentation

## 2019-10-13 NOTE — Progress Notes (Signed)
   Covid-19 Vaccination Clinic  Name:  Jazabella Brush    MRN: [id number] DOB: 25-Apr-1951  10/13/2019  Ms. Arturo was observed post Covid-19 immunization for 15 minutes without incidence. She was provided with Vaccine Information Sheet and instruction to access the V-Safe system.   Ms. Gierke was instructed to call 911 with any severe reactions post vaccine: Marland Kitchen Difficulty breathing  . Swelling of your face and throat  . A fast heartbeat  . A bad rash all over your body  . Dizziness and weakness    Immunizations Administered    Name Date Dose VIS Date Route   Moderna COVID-19 Vaccine 10/13/2019 10:41 AM 0.5 mL 07/22/2019 Intramuscular   Manufacturer: Moderna   Lot: [id number]   [id number]

## 2019-10-17 ENCOUNTER — Inpatient Hospital Stay: Payer: Medicare Other | Attending: Adult Health | Admitting: Adult Health

## 2019-10-17 ENCOUNTER — Telehealth: Payer: Self-pay | Admitting: Adult Health

## 2019-10-17 ENCOUNTER — Other Ambulatory Visit: Payer: Self-pay

## 2019-10-17 VITALS — BP 134/75 | HR 70 | Temp 98.0°F | Resp 18 | Ht 65.0 in | Wt 230.3 lb

## 2019-10-17 DIAGNOSIS — E2839 Other primary ovarian failure: Secondary | ICD-10-CM | POA: Diagnosis not present

## 2019-10-17 DIAGNOSIS — Z170 Estrogen receptor positive status [ER+]: Secondary | ICD-10-CM | POA: Insufficient documentation

## 2019-10-17 DIAGNOSIS — I872 Venous insufficiency (chronic) (peripheral): Secondary | ICD-10-CM | POA: Insufficient documentation

## 2019-10-17 DIAGNOSIS — Z79899 Other long term (current) drug therapy: Secondary | ICD-10-CM | POA: Insufficient documentation

## 2019-10-17 DIAGNOSIS — D0511 Intraductal carcinoma in situ of right breast: Secondary | ICD-10-CM

## 2019-10-17 DIAGNOSIS — Z7984 Long term (current) use of oral hypoglycemic drugs: Secondary | ICD-10-CM | POA: Insufficient documentation

## 2019-10-17 DIAGNOSIS — Z8249 Family history of ischemic heart disease and other diseases of the circulatory system: Secondary | ICD-10-CM | POA: Insufficient documentation

## 2019-10-17 DIAGNOSIS — Z7982 Long term (current) use of aspirin: Secondary | ICD-10-CM | POA: Insufficient documentation

## 2019-10-17 DIAGNOSIS — E669 Obesity, unspecified: Secondary | ICD-10-CM | POA: Insufficient documentation

## 2019-10-17 DIAGNOSIS — Z923 Personal history of irradiation: Secondary | ICD-10-CM | POA: Insufficient documentation

## 2019-10-17 DIAGNOSIS — Z833 Family history of diabetes mellitus: Secondary | ICD-10-CM | POA: Insufficient documentation

## 2019-10-17 DIAGNOSIS — Z79810 Long term (current) use of selective estrogen receptor modulators (SERMs): Secondary | ICD-10-CM | POA: Insufficient documentation

## 2019-10-17 DIAGNOSIS — I10 Essential (primary) hypertension: Secondary | ICD-10-CM | POA: Insufficient documentation

## 2019-10-17 DIAGNOSIS — E119 Type 2 diabetes mellitus without complications: Secondary | ICD-10-CM | POA: Insufficient documentation

## 2019-10-17 DIAGNOSIS — Z90710 Acquired absence of both cervix and uterus: Secondary | ICD-10-CM | POA: Insufficient documentation

## 2019-10-17 DIAGNOSIS — Z809 Family history of malignant neoplasm, unspecified: Secondary | ICD-10-CM | POA: Insufficient documentation

## 2019-10-17 DIAGNOSIS — E785 Hyperlipidemia, unspecified: Secondary | ICD-10-CM | POA: Insufficient documentation

## 2019-10-17 MED ORDER — TAMOXIFEN CITRATE 20 MG PO TABS: 20.0000 mg | ORAL_TABLET | Freq: Every day | ORAL | 3 refills | Status: DC

## 2019-10-17 NOTE — Progress Notes (Signed)
SURVIVORSHIP VISIT:    BRIEF ONCOLOGIC HISTORY:  Oncology History  Ductal carcinoma in situ (DCIS) of right breast  02/19/2019 Initial Diagnosis   Routine screening mammogram detected two indeterminate groups of calcifications in the upper right breast measuring 33mm and 35mm. Biopsy [id number]) showed LCIS with calcifications.    03/18/2019 Surgery   Right lumpectomy [name]) [phone]): DCIS, intermediate grade, focally involving a small intraductal papilloma, clear margins, no invasive carcinoma, ER+ 100%, PR+ 50%.    03/18/2019 Cancer Staging   Staging form: Breast, AJCC 8th Edition - Clinical stage from 03/18/2019: Stage 0 (cTis (DCIS), cN0, cM0, ER+, PR+) - Signed by Gardenia Phlegm, NP on 08/13/2019   03/18/2019 Cancer Staging   Staging form: Breast, AJCC 8th Edition - Pathologic stage from 03/18/2019: Stage 0 (pTis (DCIS), pN0, cM0, ER+, PR+) - Signed by Gardenia Phlegm, NP on 08/13/2019   04/30/2019 - 05/27/2019 Radiation Therapy   Adjuvant radiation therapy The whole right breast was treated to 40.05 Gy in 15 fractions. This was followed by a boost to the seroma of 10 Gy in 5 fractions, for a total dose of 50.04 Gy in 20 fractions.   05/2019 -  Anti-estrogen oral therapy   Tamoxifen 20 mg     INTERVAL HISTORY:  Ms. [name] to review her survivorship care plan detailing her treatment course for breast cancer, as well as monitoring long-term side effects of that treatment, education regarding health maintenance, screening, and overall wellness and health promotion.     Overall, Ms. [name] reports feeling quite well.  She is taking Tamoxifen daily and is tolerating it well.    REVIEW OF SYSTEMS:  Review of Systems  Constitutional: Negative for appetite change, chills, fatigue, fever and unexpected weight change.  HENT:   Negative for hearing loss, lump/mass and sore throat.   Eyes: Negative for eye problems and icterus.  Respiratory: Negative for chest  tightness, cough and shortness of breath.   Cardiovascular: Negative for chest pain, leg swelling and palpitations.  Gastrointestinal: Negative for abdominal distention, abdominal pain, constipation, diarrhea, nausea and vomiting.  Endocrine: Negative for hot flashes.  Genitourinary: Negative for difficulty urinating.   Musculoskeletal: Negative for arthralgias.  Skin: Negative for itching and rash.  Neurological: Negative for dizziness, extremity weakness, headaches and numbness.  Hematological: Negative for adenopathy. Does not bruise/bleed easily.  Psychiatric/Behavioral: Negative for depression. The patient is not nervous/anxious.   Breast: Denies any new nodularity, masses, tenderness, nipple changes, or nipple discharge.      ONCOLOGY TREATMENT TEAM:  1. Surgeon:  Dr. Dalbert Romero at [facility] 2. Medical Oncologist: Dr. Lindi Adie  3. Radiation Oncologist: Dr. Sondra Come    PAST MEDICAL/SURGICAL HISTORY:  Past Medical History:  Diagnosis Date  . Diabetes mellitus without complication ([place])   . Hyperlipidemia   . Hypertension   . Lobular carcinoma in situ (LCIS) of right breast 03/18/2019  . Obesity   . Stasis dermatitis   . Venous insufficiency    Past Surgical History:  Procedure Laterality Date  . ABDOMINAL HYSTERECTOMY    . BREAST LUMPECTOMY WITH RADIOACTIVE SEED LOCALIZATION Right 03/18/2019   Procedure: RIGHT BREAST LUMPECTOMY X 2  WITH RIGHT RADIOACTIVE SEED LOCALIZATION X 2;  Surgeon: Fanny Skates, MD;  Location: [facility];  Service: General;  Laterality: Right;  . CHOLECYSTECTOMY    . COLONOSCOPY N/A 07/02/2013   Procedure: COLONOSCOPY;  Surgeon: Danie Binder, MD;  [facility];  Service: Endoscopy;  Laterality: N/A;  9:15 AM  ALLERGIES:  No Known Allergies   CURRENT MEDICATIONS:  Outpatient Encounter Medications as of 10/17/2019  Medication Sig  . aspirin 81 MG tablet Take 81 mg by mouth daily.  . Fluocinolone  Acetonide Scalp 0.01 % OIL   . metFORMIN (GLUCOPHAGE) 500 MG tablet Take 1 tablet (500 mg total) by mouth daily with breakfast.  . pravastatin (PRAVACHOL) 40 MG tablet Take 40 mg by mouth daily.  Marland Kitchen spironolactone-hydrochlorothiazide (ALDACTAZIDE) 25-25 MG per tablet Take 1 tablet by mouth daily.   . tamoxifen (NOLVADEX) 20 MG tablet Take 1 tablet (20 mg total) by mouth daily.   No facility-administered encounter medications on file as of 10/17/2019.     ONCOLOGIC FAMILY HISTORY:  Family History  Problem Relation Age of Onset  . Diabetes Mother   . Heart disease Mother   . Hypertension Mother   . Cancer Father   . Colon cancer Neg Hx      GENETIC COUNSELING/TESTING: Not at this time  SOCIAL HISTORY:  Social History   Socioeconomic History  . Marital status: Married    Spouse name: Not on file  . Number of children: Not on file  . Years of education: Not on file  . Highest education level: Not on file  Occupational History  . Not on file  Tobacco Use  . Smoking status: Never Smoker  . Smokeless tobacco: Never Used  Substance and Sexual Activity  . Alcohol use: No  . Drug use: No  . Sexual activity: Not on file  Other Topics Concern  . Not on file  Social History Narrative  . Not on file   Social Determinants of Health   Financial Resource Strain:   . Difficulty of Paying Living Expenses: Not on file  Food Insecurity:   . Worried About Charity fundraiser in the Last Year: Not on file  . Ran Out of Food in the Last Year: Not on file  Transportation Needs:   . Lack of Transportation (Medical): Not on file  . Lack of Transportation (Non-Medical): Not on file  Physical Activity:   . Days of Exercise per Week: Not on file  . Minutes of Exercise per Session: Not on file  Stress:   . Feeling of Stress : Not on file  Social Connections:   . Frequency of Communication with Friends and Family: Not on file  . Frequency of Social Gatherings with Friends and Family:  Not on file  . Attends Religious Services: Not on file  . Active Member of Clubs or Organizations: Not on file  . Attends Archivist Meetings: Not on file  . Marital Status: Not on file  Intimate Partner Violence:   . Fear of Current or Ex-Partner: Not on file  . Emotionally Abused: Not on file  . Physically Abused: Not on file  . Sexually Abused: Not on file     OBSERVATIONS/OBJECTIVE:  BP 134/75 (BP Location: Right Arm, Patient Position: Sitting)   Pulse 70   Temp 98 F (36.7 C) (Temporal)   Resp 18   Ht 5\' 5"  (1.651 m)   Wt 230 lb 4.8 oz (104.5 kg)   SpO2 100%   BMI 38.32 kg/m  GENERAL: Patient is a well appearing female in no acute distress HEENT:  Sclerae anicteric.  Mask in place.  Neck is supple.  NODES:  No cervical, supraclavicular, or axillary lymphadenopathy palpated.  BREAST EXAM:  Right breast s/p lumpectomy and radiation, no sign of local recurrence, left breast benign  LUNGS:  Clear to auscultation bilaterally.  No wheezes or rhonchi. HEART:  Regular rate and rhythm. No murmur appreciated. ABDOMEN:  Soft, nontender.  Positive, normoactive bowel sounds. No organomegaly palpated. MSK:  No focal spinal tenderness to palpation. Full range of motion bilaterally in the upper extremities. EXTREMITIES:  No peripheral edema.   SKIN:  Clear with no obvious rashes or skin changes. No nail dyscrasia. NEURO:  Nonfocal. Well oriented.  Appropriate affect.    LABORATORY DATA:  None for this visit.  DIAGNOSTIC IMAGING:  None for this visit.      ASSESSMENT AND PLAN:  Ms.. [name] is a pleasant 69 y.o. female with Stage 0 right breast DCIS, ER+/PR+, diagnosed in 02/2019, treated with lumpectomy, adjuvant radiation therapy, and anti-estrogen therapy with Tamoxifen beginning in 05/2019.  She presents to the Survivorship Clinic for our initial meeting and routine follow-up post-completion of treatment for breast cancer.    1. Stage 0 right breast cancer:  Ms.  [name] is continuing to recover from definitive treatment for breast cancer. She will follow-up with her medical oncologist, Dr. Lindi Adie in 6 months with history and physical exam per surveillance protocol.  She will continue her anti-estrogen therapy with Tamoxifen. Thus far, she is tolerating the Tamoxifen well, with minimal side effects. She was instructed to make Dr. Lindi Adie or myself aware if she begins to experience any worsening side effects of the medication and I could see her back in clinic to help manage those side effects, as needed. Her mammogram is due 01/2020; orders placed today. Today, a comprehensive survivorship care plan and treatment summary was reviewed with the patient today detailing her breast cancer diagnosis, treatment course, potential late/long-term effects of treatment, appropriate follow-up care with recommendations for the future, and patient education resources.  A copy of this summary, along with a letter will be sent to the patient's primary care provider via mail/fax/In Basket message after today's visit.    2. Bone health:  Given Ms. [name]'s age/history of breast cancer, she is at risk for bone demineralization.  She has not undergone bone density testing, so I ordered that for her to be completed with her mammogram.  She was given education on specific activities to promote bone health.  3. Cancer screening:  Due to Ms. [name]'s history and her age, she should receive screening for skin cancers, colon cancer, and gynecologic cancers.  The information and recommendations are listed on the patient's comprehensive care plan/treatment summary and were reviewed in detail with the patient.    4. Health maintenance and wellness promotion: Ms. [name] was encouraged to consume 5-7 servings of fruits and vegetables per day. We reviewed the "Nutrition Rainbow" handout, as well as the handout "Take Control of Your Health and Reduce Your Cancer Risk" from the [employer].  She was also encouraged to engage in moderate to vigorous exercise for 30 minutes per day most days of the week. We discussed the [employer] fitness program, which is designed for cancer survivors to help them become more physically fit after cancer treatments.  She was instructed to limit her alcohol consumption and continue to abstain from tobacco use.     5. Support services/counseling: It is not uncommon for this period of the patient's cancer care trajectory to be one of many emotions and stressors.  We discussed how this can be increasingly difficult during the times of quarantine and social distancing due to the COVID-19 pandemic.   She was given information regarding our  available services and encouraged to contact me with any questions or for help enrolling in any of our support group/programs.    Follow up instructions:    -Return to cancer center in 6 months for f/u with Dr. Lindi Adie  -Mammogram due in 01/2020  -Bone Density in 01/2020 -Follow up with me in 1 year -She was recommended to continue with the appropriate pandemic precautions.   The patient was provided an opportunity to ask questions and all were answered. The patient agreed with the plan and demonstrated an understanding of the instructions.   Total encounter time: 30 minutes*  Wilber Bihari, NP 10/17/19 11:01 AM Medical Oncology and Hematology [facility] [place], [place] [address] Tel. [phone]    Fax. [phone]  *Total Encounter Time as defined by the Centers for Medicare and Medicaid Services includes, in addition to the face-to-face time of a patient visit (documented in the note above) non-face-to-face time: obtaining and reviewing outside history, ordering and reviewing medications, tests or procedures, care coordination (communications with other health care professionals or caregivers) and documentation in the medical record.

## 2019-10-17 NOTE — Addendum Note (Signed)
Addended by: Scot Dock on: 10/17/2019 11:17 AM   Modules accepted: Orders

## 2019-10-17 NOTE — Telephone Encounter (Signed)
I talk with patient regarding schedule  

## 2019-11-11 ENCOUNTER — Ambulatory Visit: Payer: Medicare Other | Attending: Family

## 2019-11-11 DIAGNOSIS — Z23 Encounter for immunization: Secondary | ICD-10-CM

## 2019-11-11 NOTE — Progress Notes (Signed)
   Covid-19 Vaccination Clinic  Name:  [name]    MRN: [id number] DOB: 07/26/1951  11/11/2019  [name] was observed post Covid-19 immunization for 15 minutes without incident. She was provided with Vaccine Information Sheet and instruction to access the V-Safe system.   [name] was instructed to call 911 with any severe reactions post vaccine: Marland Kitchen Difficulty breathing  . Swelling of face and throat  . A fast heartbeat  . A bad rash all over body  . Dizziness and weakness   Immunizations Administered    Name Date Dose VIS Date Route   Moderna COVID-19 Vaccine 11/11/2019 11:53 AM 0.5 mL 07/22/2019 Intramuscular   Manufacturer: Moderna   LotMV:[id number]   [id number]

## 2019-12-09 DIAGNOSIS — E785 Hyperlipidemia, unspecified: Secondary | ICD-10-CM | POA: Diagnosis not present

## 2019-12-09 DIAGNOSIS — I10 Essential (primary) hypertension: Secondary | ICD-10-CM | POA: Diagnosis not present

## 2019-12-09 DIAGNOSIS — I872 Venous insufficiency (chronic) (peripheral): Secondary | ICD-10-CM | POA: Diagnosis not present

## 2019-12-09 DIAGNOSIS — E1169 Type 2 diabetes mellitus with other specified complication: Secondary | ICD-10-CM | POA: Diagnosis not present

## 2020-02-02 ENCOUNTER — Other Ambulatory Visit: Payer: Self-pay

## 2020-02-02 ENCOUNTER — Ambulatory Visit
Admission: RE | Admit: 2020-02-02 | Discharge: 2020-02-02 | Disposition: A | Discharge status: Home / Self Care | Payer: Medicare Other | Source: Ambulatory Visit | Attending: Adult Health | Admitting: Adult Health

## 2020-02-02 DIAGNOSIS — D0511 Intraductal carcinoma in situ of right breast: Secondary | ICD-10-CM

## 2020-02-02 DIAGNOSIS — E2839 Other primary ovarian failure: Secondary | ICD-10-CM

## 2020-02-02 DIAGNOSIS — R928 Other abnormal and inconclusive findings on diagnostic imaging of breast: Secondary | ICD-10-CM | POA: Diagnosis not present

## 2020-02-02 DIAGNOSIS — Z780 Asymptomatic menopausal state: Secondary | ICD-10-CM | POA: Diagnosis not present

## 2020-02-02 DIAGNOSIS — M8589 Other specified disorders of bone density and structure, multiple sites: Secondary | ICD-10-CM | POA: Diagnosis not present

## 2020-02-02 HISTORY — DX: Personal history of irradiation: Z92.3

## 2020-02-12 IMAGING — MG MM PLC BREAST LOC DEV 1ST LESION INC*R*
8 of 13 series · 8 of 13 positions shown · non-contrast
Comparison: Previous exam(s).

CLINICAL DATA: Preoperative radioactive seed localization for 2
areas of LCIS in the right breast upper inner quadrant.

EXAM:
MAMMOGRAPHIC GUIDED RADIOACTIVE SEED LOCALIZATION OF THE RIGHT
BREAST

[R ML (1 of 4)]
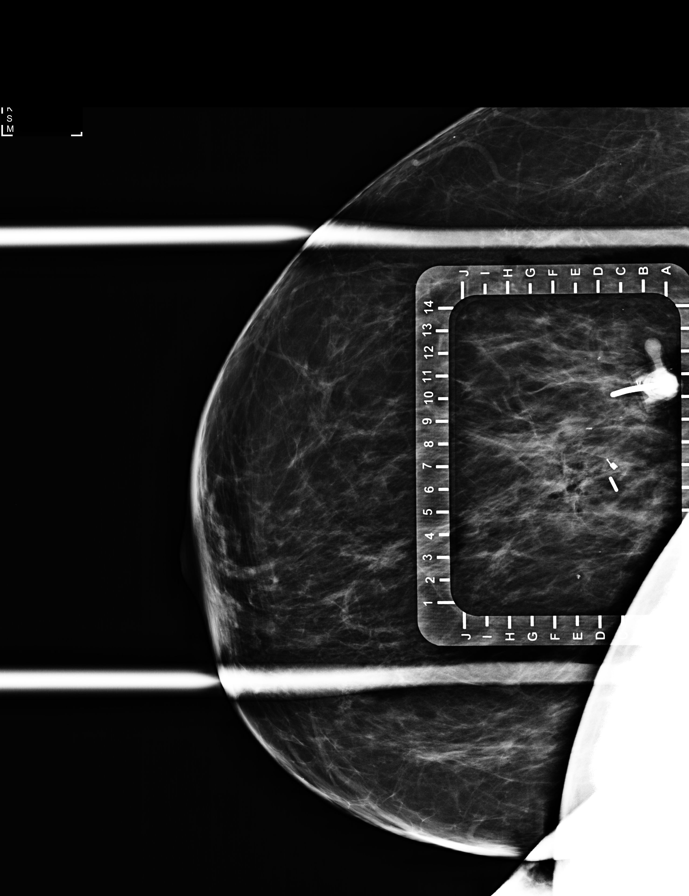

[R ML (2 of 4)]
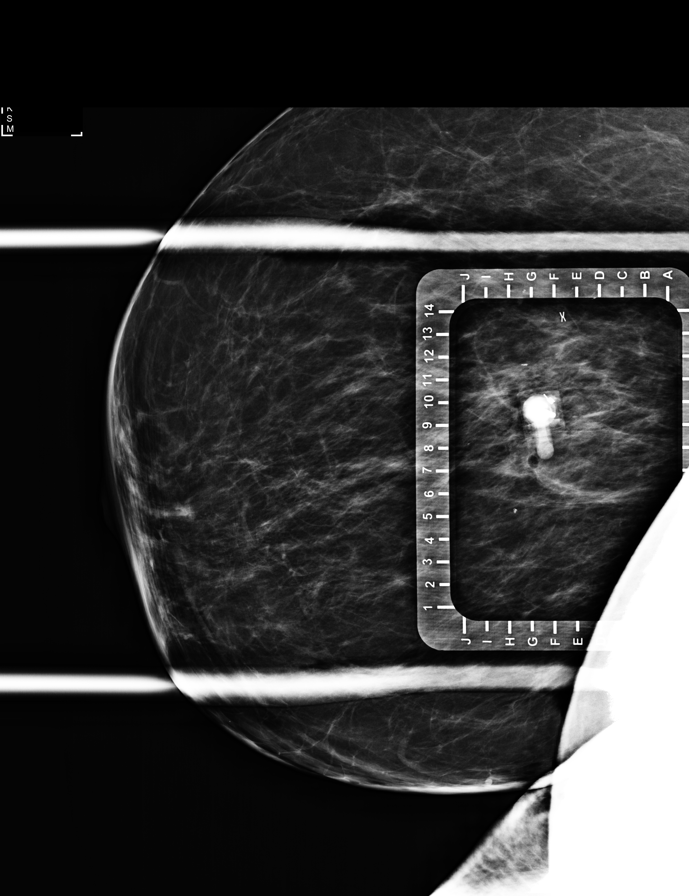

[R CC (1 of 4)]
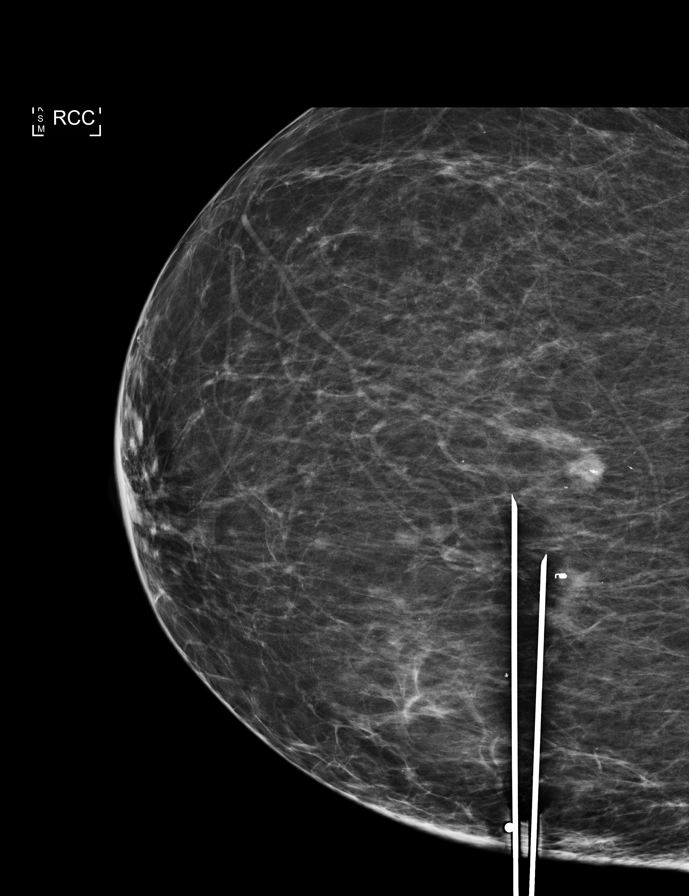

[R CC (2 of 4)]
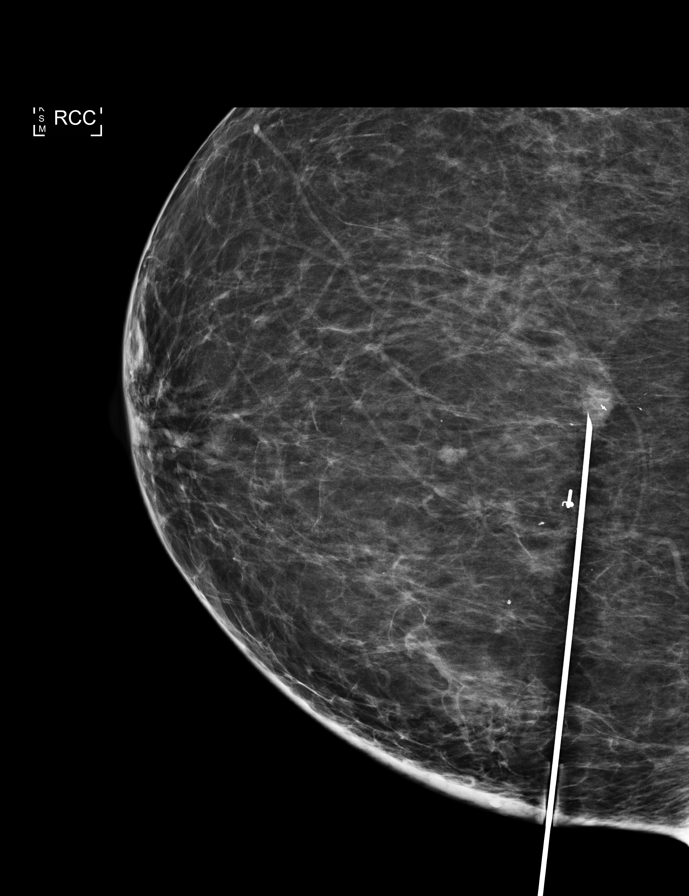

[R CC (3 of 4)]
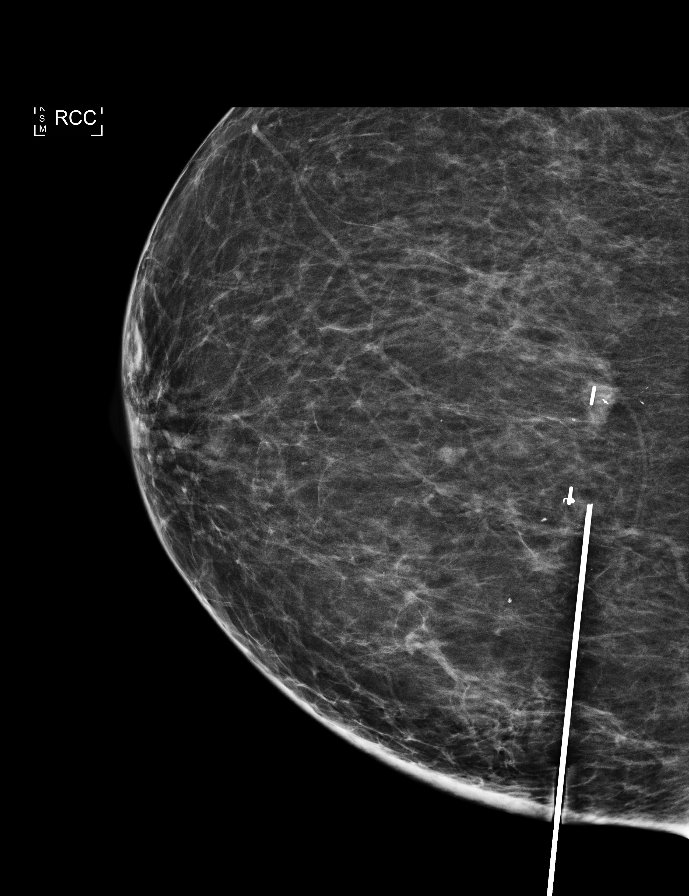

[R ML (3 of 4)]
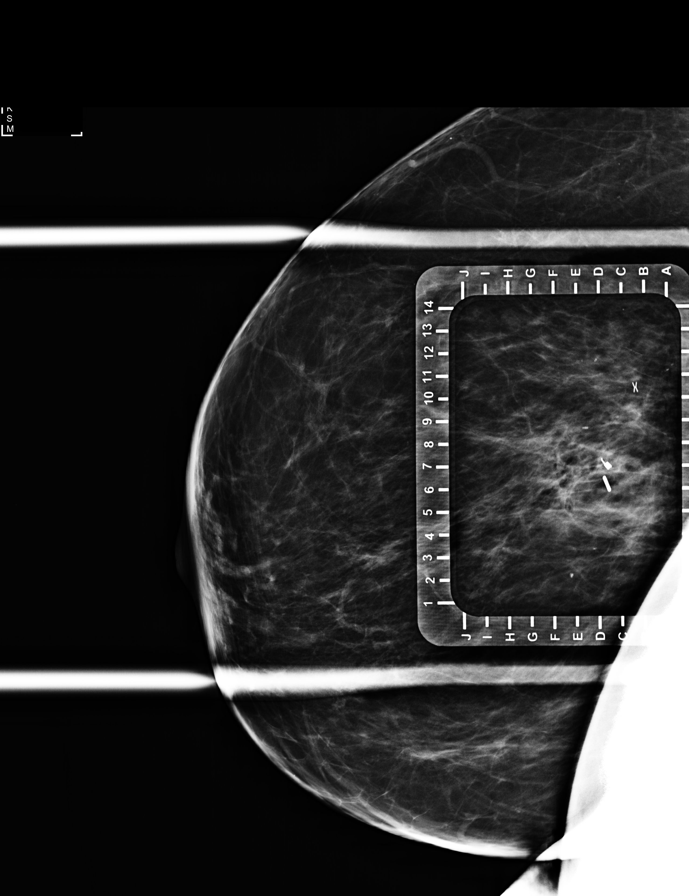

[R CC (4 of 4)]
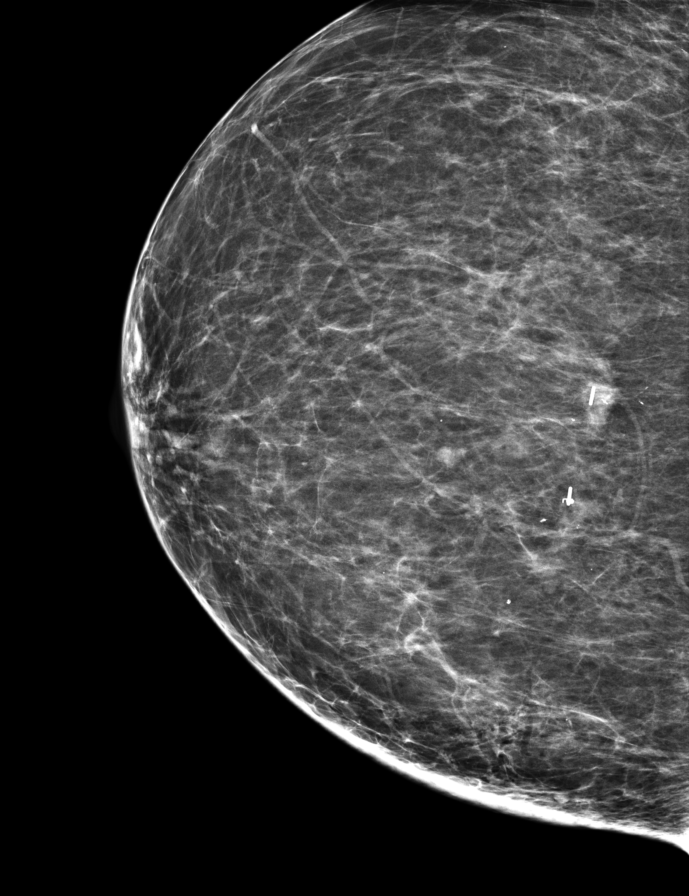

[R ML (4 of 4)]
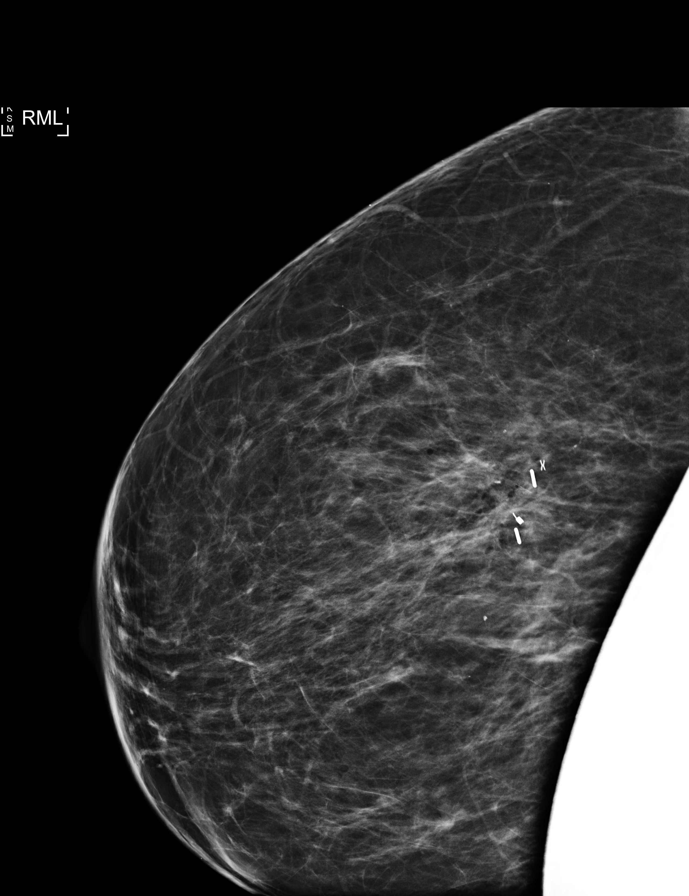

[8 of 13 positions shown; findings below may reference images not displayed]

FINDINGS: Patient presents for radioactive seed localization prior to right
breast excisional biopsy. I met with the patient and we discussed
the procedure of seed localization including benefits and
alternatives. We discussed the high likelihood of a successful
procedure. We discussed the risks of the procedure including
infection, bleeding, tissue injury and further surgery. We discussed
the low dose of radioactivity involved in the procedure. Informed,
written consent was given.

The usual time-out protocol was performed immediately prior to the
procedure.

Using mammographic guidance, sterile technique, 1% lidocaine and an
4-T8X radioactive seed, coil shaped marker was localized using a
medial approach.

Order number of [id number] seed:  [id number].

Total activity:  0.246 millicurie reference Date: March 14, 2019

Next, using mammographic guidance, sterile technique, 1% lidocaine
and an 4-T8X radioactive seed, X shaped marker was localized using a
medial approach.

Order number of [id number] seed:  [id number].

Total activity:  0.246 millicurie reference Date: March 14, 2019

The follow-up mammogram images confirm the seed in the expected
location and were marked for Dr. Giver.

Follow-up survey of the patient confirms presence of the radioactive
seeds.

The patient tolerated the procedure well and was released from the
[REDACTED]. She was given instructions regarding seed removal.
IMPRESSION: Radioactive seed localization of 2 areas of LCIS in the right
breast. No apparent complications.

## 2020-02-24 DIAGNOSIS — M545 Low back pain: Secondary | ICD-10-CM | POA: Diagnosis not present

## 2020-03-04 DIAGNOSIS — B351 Tinea unguium: Secondary | ICD-10-CM | POA: Diagnosis not present

## 2020-03-04 DIAGNOSIS — L631 Alopecia universalis: Secondary | ICD-10-CM | POA: Diagnosis not present

## 2020-03-17 DIAGNOSIS — E119 Type 2 diabetes mellitus without complications: Secondary | ICD-10-CM | POA: Diagnosis not present

## 2020-03-17 DIAGNOSIS — H40033 Anatomical narrow angle, bilateral: Secondary | ICD-10-CM | POA: Diagnosis not present

## 2020-04-18 NOTE — Progress Notes (Signed)
Patient Care Team: [name], MD as PCP - General (Family Medicine) Danie Binder, MD (Inactive) as Consulting Physician (Gastroenterology) Nicholas Lose, MD as Consulting Physician (Hematology and Oncology) Gery Pray, MD as Consulting Physician (Radiation Oncology) Fanny Skates, MD as Consulting Physician (General Surgery)  DIAGNOSIS:    ICD-10-CM   1. Ductal carcinoma in situ (DCIS) of right breast  D05.11     SUMMARY OF ONCOLOGIC HISTORY: Oncology History  Ductal carcinoma in situ (DCIS) of right breast  02/19/2019 Initial Diagnosis   Routine screening mammogram detected two indeterminate groups of calcifications in the upper right breast measuring 57mm and 7mm. Biopsy ([id number]) showed LCIS with calcifications.    03/18/2019 Surgery   Right lumpectomy Dalbert Batman) [id number]): DCIS, intermediate grade, focally involving a small intraductal papilloma, clear margins, no invasive carcinoma, ER+ 100%, PR+ 50%.    03/18/2019 Cancer Staging   Staging form: Breast, AJCC 8th Edition - Clinical stage from 03/18/2019: Stage 0 (cTis (DCIS), cN0, cM0, ER+, PR+) - Signed by Gardenia Phlegm, NP on 08/13/2019   03/18/2019 Cancer Staging   Staging form: Breast, AJCC 8th Edition - Pathologic stage from 03/18/2019: Stage 0 (pTis (DCIS), pN0, cM0, ER+, PR+) - Signed by Gardenia Phlegm, NP on 08/13/2019   04/30/2019 - 05/27/2019 Radiation Therapy   Adjuvant radiation therapy The whole right breast was treated to 40.05 Gy in 15 fractions. This was followed by a boost to the seroma of 10 Gy in 5 fractions, for a total dose of 50.04 Gy in 20 fractions.   05/2019 -  Anti-estrogen oral therapy   Tamoxifen 20 mg     CHIEF COMPLIANT: Follow-up of right breast DCIS on tamoxifen  INTERVAL HISTORY: [name] is a 69 y.o. with above-mentioned history of right breast DCIS who underwent a lumpectomy, radiation, and is currently on antiestrogen therapy with tamoxifen.  She presents to the clinic today to discuss anti-estrogen therapy.   ALLERGIES:  has No Known Allergies.  MEDICATIONS:  Current Outpatient Medications  Medication Sig Dispense Refill  . aspirin 81 MG tablet Take 81 mg by mouth daily.    . Fluocinolone Acetonide Scalp 0.01 % OIL     . metFORMIN (GLUCOPHAGE) 500 MG tablet Take 1 tablet (500 mg total) by mouth daily with breakfast.    . pravastatin (PRAVACHOL) 40 MG tablet Take 40 mg by mouth daily.    Marland Kitchen spironolactone-hydrochlorothiazide (ALDACTAZIDE) 25-25 MG per tablet Take 1 tablet by mouth daily.     . tamoxifen (NOLVADEX) 20 MG tablet Take 1 tablet (20 mg total) by mouth daily. 90 tablet 3   No current facility-administered medications for this visit.    PHYSICAL EXAMINATION: ECOG PERFORMANCE STATUS: 1 - Symptomatic but completely ambulatory  Vitals:   04/19/20 1440  BP: (!) 135/57  Pulse: 65  Resp: 18  Temp: (!) 97.2 F (36.2 C)  SpO2: 100%   Filed Weights   04/19/20 1440  Weight: 232 lb 3.2 oz (105.3 kg)       LABORATORY DATA:  I have reviewed the data as listed CMP Latest Ref Rng & Units 03/14/2019  Glucose 70 - 99 mg/dL 106(H)  BUN 8 - 23 mg/dL 23  Creatinine 0.44 - 1.00 mg/dL 1.34(H)  Sodium 135 - 145 mmol/L 142  Potassium 3.5 - 5.1 mmol/L 3.5  Chloride 98 - 111 mmol/L 101  CO2 22 - 32 mmol/L 29  Calcium 8.9 - 10.3 mg/dL 9.8  Total Protein 6.5 - 8.1 g/dL 7.4  Total  Bilirubin 0.3 - 1.2 mg/dL 0.6  Alkaline Phos 38 - 126 U/L 71  AST 15 - 41 U/L 19  ALT 0 - 44 U/L 17    Lab Results  Component Value Date   WBC 6.2 03/14/2019   HGB 12.3 03/14/2019   HCT 37.9 03/14/2019   MCV 90.0 03/14/2019   PLT 206 03/14/2019   NEUTROABS 4.1 03/14/2019    ASSESSMENT & PLAN:  Ductal carcinoma in situ (DCIS) of right breast 03/18/2019: Screening mammogram detected indeterminate groups of calcifications 1.5 cm and 0.8 cm biopsy initially showed LCIS.  Right lumpectomy Dalbert Batman): DCIS, intermediate grade, focally  involving a small intraductal papilloma, clear margins, no invasive carcinoma, ER+ 100%, PR+ 50 Adjuvant radiation 05/01/2019-05/23/2019  Treatment plan: Tamoxifen 20 mg daily x5 years started October 2020 Tamoxifen toxicities: No adverse effects to tamoxifen  Breast cancer surveillance: 1.  Mammogram 02/02/2020: Benign breast density category B 2. breast exam 04/19/2020: Benign 3.  Bone density 02/02/2020: T score -1.4: Mild osteopenia  Return to clinic in 1 year for follow-up    No orders of the defined types were placed in this encounter.  The patient has a good understanding of the overall plan. she agrees with it. she will call with any problems that may develop before the next visit here.  Total time spent: 20 mins including face to face time and time spent for planning, charting and coordination of care  Nicholas Lose, MD 04/19/2020  I, Cloyde Reams Dorshimer, am acting as scribe for Dr. Nicholas Lose.  I have reviewed the above documentation for accuracy and completeness, and I agree with the above.

## 2020-04-19 ENCOUNTER — Inpatient Hospital Stay: Payer: Medicare Other | Attending: Hematology and Oncology | Admitting: Hematology and Oncology

## 2020-04-19 ENCOUNTER — Other Ambulatory Visit: Payer: Self-pay

## 2020-04-19 DIAGNOSIS — Z79899 Other long term (current) drug therapy: Secondary | ICD-10-CM | POA: Diagnosis not present

## 2020-04-19 DIAGNOSIS — Z79810 Long term (current) use of selective estrogen receptor modulators (SERMs): Secondary | ICD-10-CM | POA: Insufficient documentation

## 2020-04-19 DIAGNOSIS — Z170 Estrogen receptor positive status [ER+]: Secondary | ICD-10-CM | POA: Insufficient documentation

## 2020-04-19 DIAGNOSIS — Z7984 Long term (current) use of oral hypoglycemic drugs: Secondary | ICD-10-CM | POA: Diagnosis not present

## 2020-04-19 DIAGNOSIS — D0511 Intraductal carcinoma in situ of right breast: Secondary | ICD-10-CM | POA: Insufficient documentation

## 2020-04-19 MED ORDER — TAMOXIFEN CITRATE 20 MG PO TABS: 20.0000 mg | ORAL_TABLET | Freq: Every day | ORAL | 3 refills | Status: DC

## 2020-04-19 MED ORDER — VITAMIN D 50 MCG (2000 UT) PO CAPS: 1.0000 | ORAL_CAPSULE | Freq: Every day | ORAL | Status: AC

## 2020-04-19 MED ORDER — CALCIUM CARBONATE 1500 (600 CA) MG PO TABS: 600.0000 mg | ORAL_TABLET | Freq: Every day | ORAL | 0 refills | Status: AC

## 2020-04-19 NOTE — Assessment & Plan Note (Addendum)
03/18/2019: Screening mammogram detected indeterminate groups of calcifications 1.5 cm and 0.8 cm biopsy initially showed LCIS.  Right lumpectomy Dalbert Batman): DCIS, intermediate grade, focally involving a small intraductal papilloma, clear margins, no invasive carcinoma, ER+ 100%, PR+ 50 Adjuvant radiation 05/01/2019-05/23/2019  Treatment plan: Tamoxifen 20 mg daily x5 years Tamoxifen toxicities:  Breast cancer surveillance: 1.  Mammogram 02/02/2020: Benign breast density category B 2. breast exam 04/19/2020: Benign 3.  Bone density 02/02/2020: T score -1.4: Mild osteopenia  Return to clinic in 1 year for follow-up

## 2020-04-20 ENCOUNTER — Telehealth: Payer: Self-pay | Admitting: Hematology and Oncology

## 2020-04-20 NOTE — Telephone Encounter (Signed)
Scheduled per 8/30 los. Called and spoke with pt,confirmed 8/30 appt

## 2020-05-14 DIAGNOSIS — D0501 Lobular carcinoma in situ of right breast: Secondary | ICD-10-CM | POA: Diagnosis not present

## 2020-05-14 DIAGNOSIS — D0511 Intraductal carcinoma in situ of right breast: Secondary | ICD-10-CM | POA: Diagnosis not present

## 2020-05-20 DIAGNOSIS — Z7984 Long term (current) use of oral hypoglycemic drugs: Secondary | ICD-10-CM | POA: Diagnosis not present

## 2020-05-20 DIAGNOSIS — I10 Essential (primary) hypertension: Secondary | ICD-10-CM | POA: Diagnosis not present

## 2020-05-20 DIAGNOSIS — E7849 Other hyperlipidemia: Secondary | ICD-10-CM | POA: Diagnosis not present

## 2020-05-20 DIAGNOSIS — E1165 Type 2 diabetes mellitus with hyperglycemia: Secondary | ICD-10-CM | POA: Diagnosis not present

## 2020-06-19 DIAGNOSIS — E7849 Other hyperlipidemia: Secondary | ICD-10-CM | POA: Diagnosis not present

## 2020-06-19 DIAGNOSIS — Z7984 Long term (current) use of oral hypoglycemic drugs: Secondary | ICD-10-CM | POA: Diagnosis not present

## 2020-06-19 DIAGNOSIS — E1165 Type 2 diabetes mellitus with hyperglycemia: Secondary | ICD-10-CM | POA: Diagnosis not present

## 2020-06-19 DIAGNOSIS — I10 Essential (primary) hypertension: Secondary | ICD-10-CM | POA: Diagnosis not present

## 2020-08-20 DIAGNOSIS — E1165 Type 2 diabetes mellitus with hyperglycemia: Secondary | ICD-10-CM | POA: Diagnosis not present

## 2020-08-20 DIAGNOSIS — E7849 Other hyperlipidemia: Secondary | ICD-10-CM | POA: Diagnosis not present

## 2020-08-20 DIAGNOSIS — Z7984 Long term (current) use of oral hypoglycemic drugs: Secondary | ICD-10-CM | POA: Diagnosis not present

## 2020-08-20 DIAGNOSIS — I10 Essential (primary) hypertension: Secondary | ICD-10-CM | POA: Diagnosis not present

## 2020-09-30 ENCOUNTER — Telehealth: Payer: Self-pay | Admitting: Adult Health

## 2020-09-30 NOTE — Telephone Encounter (Signed)
Rescheduled appt per LC schedule change. Pt confirmed cancellation and new appt date and time.

## 2020-10-18 ENCOUNTER — Ambulatory Visit: Payer: Medicare Other | Admitting: Adult Health

## 2020-10-27 ENCOUNTER — Other Ambulatory Visit: Payer: Self-pay

## 2020-10-27 ENCOUNTER — Ambulatory Visit: Payer: Medicare Other | Admitting: Podiatry

## 2020-10-27 DIAGNOSIS — B351 Tinea unguium: Secondary | ICD-10-CM

## 2020-10-27 DIAGNOSIS — Z79899 Other long term (current) drug therapy: Secondary | ICD-10-CM | POA: Diagnosis not present

## 2020-10-27 DIAGNOSIS — M79674 Pain in right toe(s): Secondary | ICD-10-CM

## 2020-10-27 DIAGNOSIS — L600 Ingrowing nail: Secondary | ICD-10-CM | POA: Diagnosis not present

## 2020-10-27 DIAGNOSIS — M79675 Pain in left toe(s): Secondary | ICD-10-CM

## 2020-10-27 MED ORDER — GENTAMICIN SULFATE 0.1 % EX CREA: 1.0000 "application " | TOPICAL_CREAM | Freq: Two times a day (BID) | CUTANEOUS | 1 refills | Status: DC

## 2020-10-28 ENCOUNTER — Other Ambulatory Visit: Payer: Self-pay | Admitting: Podiatry

## 2020-10-28 DIAGNOSIS — Z79899 Other long term (current) drug therapy: Secondary | ICD-10-CM | POA: Diagnosis not present

## 2020-10-29 LAB — HEPATIC FUNCTION PANEL
ALT: 15 IU/L (ref 0–32)
AST: 18 IU/L (ref 0–40)
Albumin: 4 g/dL (ref 3.7–4.7)
Alkaline Phosphatase: 61 IU/L (ref 44–121)
Bilirubin Total: <0.2 mg/dL (ref 0.0–1.2)
Bilirubin, Direct: <0.1 mg/dL (ref 0.00–0.40)
Total Protein: 7.3 g/dL (ref 6.0–8.5)

## 2020-10-29 LAB — SPECIMEN STATUS REPORT

## 2020-11-04 ENCOUNTER — Other Ambulatory Visit: Payer: Self-pay

## 2020-11-04 ENCOUNTER — Inpatient Hospital Stay: Payer: Medicare Other | Attending: Adult Health | Admitting: Adult Health

## 2020-11-04 VITALS — BP 142/65 | HR 77 | Temp 97.4°F | Resp 18 | Ht 65.0 in | Wt 231.8 lb

## 2020-11-04 DIAGNOSIS — D0511 Intraductal carcinoma in situ of right breast: Secondary | ICD-10-CM | POA: Insufficient documentation

## 2020-11-04 DIAGNOSIS — Z8249 Family history of ischemic heart disease and other diseases of the circulatory system: Secondary | ICD-10-CM | POA: Diagnosis not present

## 2020-11-04 DIAGNOSIS — Z833 Family history of diabetes mellitus: Secondary | ICD-10-CM | POA: Diagnosis not present

## 2020-11-04 DIAGNOSIS — Z79810 Long term (current) use of selective estrogen receptor modulators (SERMs): Secondary | ICD-10-CM | POA: Diagnosis not present

## 2020-11-04 DIAGNOSIS — E119 Type 2 diabetes mellitus without complications: Secondary | ICD-10-CM | POA: Insufficient documentation

## 2020-11-04 DIAGNOSIS — I10 Essential (primary) hypertension: Secondary | ICD-10-CM | POA: Insufficient documentation

## 2020-11-04 DIAGNOSIS — Z923 Personal history of irradiation: Secondary | ICD-10-CM | POA: Diagnosis not present

## 2020-11-04 NOTE — Progress Notes (Signed)
[facility] Follow up:    Sara Beard, MD [address] [place] [address]   DIAGNOSIS: Cancer Staging Ductal carcinoma in situ (DCIS) of right breast Staging form: Breast, AJCC 8th Edition - Clinical stage from 03/18/2019: Stage 0 (cTis (DCIS), cN0, cM0, ER+, PR+) - Signed by Gardenia Phlegm, NP on 08/13/2019 Stage prefix: Initial diagnosis - Pathologic stage from 03/18/2019: Stage 0 (pTis (DCIS), pN0, cM0, ER+, PR+) - Signed by Gardenia Phlegm, NP on 08/13/2019   SUMMARY OF ONCOLOGIC HISTORY: Oncology History  Ductal carcinoma in situ (DCIS) of right breast  02/19/2019 Initial Diagnosis   Routine screening mammogram detected two indeterminate groups of calcifications in the upper right breast measuring 2mm and 68mm. Biopsy ([id number]) showed LCIS with calcifications.    03/18/2019 Surgery   Right lumpectomy Dalbert Batman) [phone]): DCIS, intermediate grade, focally involving a small intraductal papilloma, clear margins, no invasive carcinoma, ER+ 100%, PR+ 50%.    03/18/2019 Cancer Staging   Staging form: Breast, AJCC 8th Edition - Clinical stage from 03/18/2019: Stage 0 (cTis (DCIS), cN0, cM0, ER+, PR+) - Signed by Gardenia Phlegm, NP on 08/13/2019   03/18/2019 Cancer Staging   Staging form: Breast, AJCC 8th Edition - Pathologic stage from 03/18/2019: Stage 0 (pTis (DCIS), pN0, cM0, ER+, PR+) - Signed by Gardenia Phlegm, NP on 08/13/2019   04/30/2019 - 05/27/2019 Radiation Therapy   Adjuvant radiation therapy The whole right breast was treated to 40.05 Gy in 15 fractions. This was followed by a boost to the seroma of 10 Gy in 5 fractions, for a total dose of 50.04 Gy in 20 fractions.   05/2019 -  Anti-estrogen oral therapy   Tamoxifen 20 mg     CURRENT THERAPY: Tamoxifen  INTERVAL HISTORY: [name] 70 y.o. female returns for evaluation of her breast cancer.  She is taking Tamoxifen daily and is  tolerating it well.  She has no breast changes.  She denies any hot flashes, vaginal wetness, arthralgias.     Patient Active Problem List   Diagnosis Date Noted  . Ductal carcinoma in situ (DCIS) of right breast 03/18/2019  . Lymphedema 03/28/2013  . HYPERLIPIDEMIA-MIXED 02/08/2009  . HYPERTENSION, MALIGNANT, UNCONTROLLED 02/08/2009  . VENOUS INSUFFICIENCY, CHRONIC 02/08/2009  . EDEMA 02/08/2009  . CHEST PAIN-UNSPECIFIED 02/08/2009    has No Known Allergies.  MEDICAL HISTORY: Past Medical History:  Diagnosis Date  . Diabetes mellitus without complication ([place])   . Hyperlipidemia   . Hypertension   . Lobular carcinoma in situ (LCIS) of right breast 03/18/2019  . Obesity   . Personal history of radiation therapy   . Stasis dermatitis   . Venous insufficiency     SURGICAL HISTORY: Past Surgical History:  Procedure Laterality Date  . ABDOMINAL HYSTERECTOMY    . BREAST LUMPECTOMY Right 03/18/2019  . BREAST LUMPECTOMY WITH RADIOACTIVE SEED LOCALIZATION Right 03/18/2019   Procedure: RIGHT BREAST LUMPECTOMY X 2  WITH RIGHT RADIOACTIVE SEED LOCALIZATION X 2;  Surgeon: Fanny Skates, MD;  Location: [facility];  Service: General;  Laterality: Right;  . CHOLECYSTECTOMY    . COLONOSCOPY N/A 07/02/2013   Procedure: COLONOSCOPY;  Surgeon: Danie Binder, MD;  [facility];  Service: Endoscopy;  Laterality: N/A;  9:15 AM    SOCIAL HISTORY: Social History   Socioeconomic History  . Marital status: Married    Spouse name: Not on file  . Number of children: Not on file  .  Years of education: Not on file  . Highest education level: Not on file  Occupational History  . Not on file  Tobacco Use  . Smoking status: Never Smoker  . Smokeless tobacco: Never Used  Substance and Sexual Activity  . Alcohol use: No  . Drug use: No  . Sexual activity: Not on file  Other Topics Concern  . Not on file  Social History Narrative  . Not on file   Social  Determinants of Health   Financial Resource Strain: Not on file  Food Insecurity: Not on file  Transportation Needs: Not on file  Physical Activity: Not on file  Stress: Not on file  Social Connections: Not on file  Intimate Partner Violence: Not on file    FAMILY HISTORY: Family History  Problem Relation Age of Onset  . Diabetes Mother   . Heart disease Mother   . Hypertension Mother   . Cancer Father   . Colon cancer Neg Hx     Review of Systems  Constitutional: Negative for appetite change, chills, fatigue, fever and unexpected weight change.  HENT:   Negative for hearing loss, lump/mass and trouble swallowing.   Eyes: Negative for eye problems and icterus.  Respiratory: Negative for chest tightness, cough and shortness of breath.   Cardiovascular: Negative for chest pain, leg swelling and palpitations.  Gastrointestinal: Negative for abdominal distention, abdominal pain, constipation, diarrhea, nausea and vomiting.  Endocrine: Negative for hot flashes.  Genitourinary: Negative for difficulty urinating.   Musculoskeletal: Negative for arthralgias.  Skin: Negative for itching and rash.  Neurological: Negative for dizziness, extremity weakness, headaches and numbness.  Hematological: Negative for adenopathy. Does not bruise/bleed easily.  Psychiatric/Behavioral: Negative for depression. The patient is not nervous/anxious.       PHYSICAL EXAMINATION  ECOG PERFORMANCE STATUS: 1 - Symptomatic but completely ambulatory  Vitals:   11/04/20 1332  BP: (!) 142/65  Pulse: 77  Resp: 18  Temp: (!) 97.4 F (36.3 C)  SpO2: 97%    Physical Exam Constitutional:      General: She is not in acute distress.    Appearance: Normal appearance. She is not toxic-appearing.  HENT:     Head: Normocephalic and atraumatic.  Eyes:     General: No scleral icterus. Cardiovascular:     Rate and Rhythm: Normal rate and regular rhythm.     Pulses: Normal pulses.     Heart sounds:  Normal heart sounds.  Pulmonary:     Effort: Pulmonary effort is normal.     Breath sounds: Normal breath sounds.     Comments: Right breast s/p lumpectomy and radiation, no sign of local recurrence, left breast benign Abdominal:     General: Abdomen is flat. Bowel sounds are normal. There is no distension.     Palpations: Abdomen is soft.     Tenderness: There is no abdominal tenderness.  Musculoskeletal:        General: No swelling.     Cervical back: Neck supple.  Lymphadenopathy:     Cervical: No cervical adenopathy.  Skin:    General: Skin is warm and dry.     Findings: No rash.  Neurological:     General: No focal deficit present.     Mental Status: She is alert.  Psychiatric:        Mood and Affect: Mood normal.        Behavior: Behavior normal.          ASSESSMENT and THERAPY PLAN:  Ductal carcinoma in situ (DCIS) of right breast 03/18/2019: Screening mammogram detected indeterminate groups of calcifications 1.5 cm and 0.8 cm biopsy initially showed LCIS.  Right lumpectomy Dalbert Batman): DCIS, intermediate grade, focally involving a small intraductal papilloma, clear margins, no invasive carcinoma, ER+ 100%, PR+ 50 Adjuvant radiation 05/01/2019-05/23/2019  Treatment plan: Tamoxifen 20 mg daily x5 years Tamoxifen toxicities:  Breast cancer surveillance: 1.  Mammogram 02/02/2020: Benign breast density category B 2. breast exam 11/04/2020: Benign 3.  Bone density 02/02/2020: T score -1.4: Mild osteopenia  We reviewed health maintenance, cancer prevention, and cancer screening.  I recommended healthy diet and exercise.  She will return in 6 months for f/u with Dr. Lindi Adie.    All questions were answered. The patient knows to call the clinic with any problems, questions or concerns. We can certainly see the patient much sooner if necessary.  Total encounter time: 20 minutes*  Wilber Bihari, NP 11/05/20 4:05 PM Medical Oncology and Hematology [facility] [place], [place] [address] Tel. [phone]    Fax. [phone]  *Total Encounter Time as defined by the Centers for Medicare and Medicaid Services includes, in addition to the face-to-face time of a patient visit (documented in the note above) non-face-to-face time: obtaining and reviewing outside history, ordering and reviewing medications, tests or procedures, care coordination (communications with other health care professionals or caregivers) and documentation in the medical record.

## 2020-11-04 NOTE — Patient Instructions (Signed)

## 2020-11-05 ENCOUNTER — Encounter: Payer: Self-pay | Admitting: Adult Health

## 2020-11-05 ENCOUNTER — Telehealth: Payer: Self-pay | Admitting: Adult Health

## 2020-11-05 NOTE — Assessment & Plan Note (Signed)
03/18/2019: Screening mammogram detected indeterminate groups of calcifications 1.5 cm and 0.8 cm biopsy initially showed LCIS.  Right lumpectomy Dalbert Batman): DCIS, intermediate grade, focally involving a small intraductal papilloma, clear margins, no invasive carcinoma, ER+ 100%, PR+ 50 Adjuvant radiation 05/01/2019-05/23/2019  Treatment plan: Tamoxifen 20 mg daily x5 years Tamoxifen toxicities:  Breast cancer surveillance: 1.  Mammogram 02/02/2020: Benign breast density category B 2. breast exam 11/04/2020: Benign 3.  Bone density 02/02/2020: T score -1.4: Mild osteopenia  We reviewed health maintenance, cancer prevention, and cancer screening.  I recommended healthy diet and exercise.  She will return in 6 months for f/u with Dr. Lindi Adie.

## 2020-11-05 NOTE — Telephone Encounter (Signed)
Scheduled appt per 3/17 los. Pt aware.

## 2020-11-09 NOTE — Progress Notes (Signed)
   Subjective: 70 y.o. female presenting today as a new patient for evaluation of painful symptomatic toenails that are growing inward on her great toes.  She is concerned that she has had toenail fungus for the last 2-3 years.  She has tried different over-the-counter and prescription topical treatments with minimal improvement.  She states that her big toenails are most symptomatic and painful especially in close toed shoes.  She presents for further treatment and evaluation  Past Medical History:  Diagnosis Date  . Diabetes mellitus without complication ([place])   . Hyperlipidemia   . Hypertension   . Lobular carcinoma in situ (LCIS) of right breast 03/18/2019  . Obesity   . Personal history of radiation therapy   . Stasis dermatitis   . Venous insufficiency     Objective: Physical Exam General: The patient is alert and oriented x3 in no acute distress.  Dermatology: Hyperkeratotic, discolored, thickened, onychodystrophy noted 1-5 bilateral. Skin is warm, dry and supple bilateral lower extremities. Negative for open lesions or macerations.  Significant associated tenderness to palpation to the bilateral great toenails.  The nails appear to be thickened and intruding into the medial and lateral nail folds of the toe.  Vascular: Palpable pedal pulses bilaterally. No edema or erythema noted. Capillary refill within normal limits.  Neurological: Epicritic and protective threshold grossly intact bilaterally.   Musculoskeletal Exam: Range of motion within normal limits to all pedal and ankle joints bilateral. Muscle strength 5/5 in all groups bilateral.   Assessment: #1 Onychomycosis of toenails 1-5 bilateral #2  Ingrown toenails bilateral great toes  Plan of Care:  #1 Patient was evaluated. #2  Today we discussed different treatment options.  I do believe the best option to permanently resolve the onychomycosis of the toenails in general is oral antifungal medication since this is the  most effective modality.  Prior to prescribing Lamisil, an order was placed for hepatic function panel. #3 in regards to the pain associated to the bilateral great toenails.  The patient would like to permanently remove the great toenail plates.  Treatment options were discussed and I do agree this would help alleviate the patient's symptoms since she is dealt with painful symptomatic toenails for several years on her back great toes.  The toes were prepped in aseptic manner and digital block performed using 2% lidocaine plain. #4 the toenails were avulsed in toto followed by 3 x [id number] application of phenol and alcohol flush.  Light dressings applied #5 post care instructions provided.  Return to clinic in 3 weeks to review hepatic function panel and possibly initiate oral Lamisil treatment   Edrick Kins, DPM [facility]  Dr. Edrick Kins, [address]                                        [place], [place] [address]                Office [phone]  Fax [phone]

## 2020-11-22 ENCOUNTER — Ambulatory Visit: Payer: Medicare Other | Admitting: Podiatry

## 2020-11-22 ENCOUNTER — Other Ambulatory Visit: Payer: Self-pay

## 2020-11-22 DIAGNOSIS — M79675 Pain in left toe(s): Secondary | ICD-10-CM | POA: Diagnosis not present

## 2020-11-22 DIAGNOSIS — B351 Tinea unguium: Secondary | ICD-10-CM | POA: Diagnosis not present

## 2020-11-22 DIAGNOSIS — M79674 Pain in right toe(s): Secondary | ICD-10-CM

## 2020-11-22 MED ORDER — TERBINAFINE HCL 250 MG PO TABS: 250.0000 mg | ORAL_TABLET | Freq: Every day | ORAL | 0 refills | Status: DC

## 2020-12-01 NOTE — Progress Notes (Signed)
   Subjective: 70 y.o. female presenting today status post bilateral total permanent nail avulsions to the bilateral great toes.  Patient states that she is doing well.  She followed all of her soaking instructions.  She also presents today to review her liver function panel and initiate Lamisil if possible for the remaining onychomycosis of the toenails  Past Medical History:  Diagnosis Date  . Diabetes mellitus without complication ([place])   . Hyperlipidemia   . Hypertension   . Lobular carcinoma in situ (LCIS) of right breast 03/18/2019  . Obesity   . Personal history of radiation therapy   . Stasis dermatitis   . Venous insufficiency     Objective: Physical Exam General: The patient is alert and oriented x3 in no acute distress.  Dermatology: Hyperkeratotic, discolored, thickened, onychodystrophy noted 2-5 bilateral. Skin is warm, dry and supple bilateral lower extremities. Negative for open lesions or macerations.  Vascular: Palpable pedal pulses bilaterally. No edema or erythema noted. Capillary refill within normal limits.  Neurological: Epicritic and protective threshold grossly intact bilaterally.   Musculoskeletal Exam: Range of motion within normal limits to all pedal and ankle joints bilateral. Muscle strength 5/5 in all groups bilateral.   Assessment: #1 Onychomycosis of toenails 2-5 bilateral #2  S/P total permanent nail avulsions bilateral great toes  Plan of Care:  #1 Patient was evaluated. #2  Hepatic function panel reviewed.  Within normal limits #3 prescription for Lamisil 250 mg #90 daily #4 return to clinic in 6 months   Edrick Kins, DPM [facility]  Dr. Edrick Kins, DPM    [address], [place] [address]                Office [phone]  Fax [phone]

## 2020-12-20 ENCOUNTER — Other Ambulatory Visit: Payer: Self-pay | Admitting: Family Medicine

## 2020-12-20 DIAGNOSIS — Z1231 Encounter for screening mammogram for malignant neoplasm of breast: Secondary | ICD-10-CM

## 2020-12-22 ENCOUNTER — Other Ambulatory Visit: Payer: Self-pay

## 2020-12-22 ENCOUNTER — Ambulatory Visit: Payer: Medicare Other | Admitting: Podiatry

## 2020-12-22 DIAGNOSIS — M79674 Pain in right toe(s): Secondary | ICD-10-CM

## 2020-12-22 DIAGNOSIS — B351 Tinea unguium: Secondary | ICD-10-CM | POA: Diagnosis not present

## 2020-12-22 DIAGNOSIS — L600 Ingrowing nail: Secondary | ICD-10-CM | POA: Diagnosis not present

## 2020-12-22 DIAGNOSIS — M79675 Pain in left toe(s): Secondary | ICD-10-CM | POA: Diagnosis not present

## 2020-12-22 NOTE — Progress Notes (Signed)
   Subjective: 70 y.o. female presenting today status post bilateral total permanent nail avulsions to the bilateral great toes.  Patient is doing well however she does notice some bleeding to the right great toenail avulsion site that she is concerned about.  She is applying antibiotic cream and still soaking her foot as instructed.  She was concerned for possible infection and she presents for further treatment and evaluation  Past Medical History:  Diagnosis Date  . Diabetes mellitus without complication ([place])   . Hyperlipidemia   . Hypertension   . Lobular carcinoma in situ (LCIS) of right breast 03/18/2019  . Obesity   . Personal history of radiation therapy   . Stasis dermatitis   . Venous insufficiency     Objective: Physical Exam General: The patient is alert and oriented x3 in no acute distress.  Dermatology: Hyperkeratotic, discolored, thickened, onychodystrophy noted 2-5 bilateral. Skin is warm, dry and supple bilateral lower extremities.   The nail avulsion sites to the bilateral great toes appear healthy with routine healing.  There is some granular tissue to the nailbed of the right hallux.  There is no purulence or malodor noted.  No erythema around the area.  There is some maceration at the base of the nailbed but otherwise no clinical evidence of infection and good routine healing  Vascular: Palpable pedal pulses bilaterally. No edema or erythema noted. Capillary refill within normal limits.  Neurological: Epicritic and protective threshold grossly intact bilaterally.   Musculoskeletal Exam: Range of motion within normal limits to all pedal and ankle joints bilateral. Muscle strength 5/5 in all groups bilateral.   Assessment: #1 Onychomycosis of toenails 2-5 bilateral #2  S/P total permanent nail avulsions bilateral great toes  Plan of Care:  #1 Patient was evaluated. #2  continue Lamisil 250 mg #90 daily as prescribed #3 continue daily soaking with application of  antibiotic gentamicin cream and a light Band-Aid throughout the day.  Recommend that she lifts the toes air out in the evenings #4 return to clinic in 6 months for reevaluation of the toenail fungus   Edrick Kins, DPM [facility]  Dr. Edrick Kins, DPM    [address], [place] [address]                Office [phone]  Fax [phone]

## 2021-01-24 DIAGNOSIS — I872 Venous insufficiency (chronic) (peripheral): Secondary | ICD-10-CM | POA: Diagnosis not present

## 2021-01-24 DIAGNOSIS — Z0001 Encounter for general adult medical examination with abnormal findings: Secondary | ICD-10-CM | POA: Diagnosis not present

## 2021-01-24 DIAGNOSIS — I10 Essential (primary) hypertension: Secondary | ICD-10-CM | POA: Diagnosis not present

## 2021-01-24 DIAGNOSIS — M545 Low back pain, unspecified: Secondary | ICD-10-CM | POA: Diagnosis not present

## 2021-01-24 DIAGNOSIS — E1169 Type 2 diabetes mellitus with other specified complication: Secondary | ICD-10-CM | POA: Diagnosis not present

## 2021-01-24 DIAGNOSIS — M170 Bilateral primary osteoarthritis of knee: Secondary | ICD-10-CM | POA: Diagnosis not present

## 2021-01-24 DIAGNOSIS — E785 Hyperlipidemia, unspecified: Secondary | ICD-10-CM | POA: Diagnosis not present

## 2021-02-16 ENCOUNTER — Other Ambulatory Visit: Payer: Self-pay

## 2021-02-16 ENCOUNTER — Ambulatory Visit
Admission: RE | Admit: 2021-02-16 | Discharge: 2021-02-16 | Disposition: A | Discharge status: Home / Self Care | Payer: Medicare Other | Source: Ambulatory Visit | Attending: Family Medicine | Admitting: Family Medicine

## 2021-02-16 DIAGNOSIS — Z1231 Encounter for screening mammogram for malignant neoplasm of breast: Secondary | ICD-10-CM

## 2021-02-17 ENCOUNTER — Other Ambulatory Visit: Payer: Self-pay | Admitting: Family Medicine

## 2021-02-17 DIAGNOSIS — E7849 Other hyperlipidemia: Secondary | ICD-10-CM | POA: Diagnosis not present

## 2021-02-17 DIAGNOSIS — I10 Essential (primary) hypertension: Secondary | ICD-10-CM | POA: Diagnosis not present

## 2021-02-17 DIAGNOSIS — E1165 Type 2 diabetes mellitus with hyperglycemia: Secondary | ICD-10-CM | POA: Diagnosis not present

## 2021-02-17 DIAGNOSIS — Z853 Personal history of malignant neoplasm of breast: Secondary | ICD-10-CM

## 2021-02-17 DIAGNOSIS — Z7984 Long term (current) use of oral hypoglycemic drugs: Secondary | ICD-10-CM | POA: Diagnosis not present

## 2021-02-23 ENCOUNTER — Other Ambulatory Visit: Payer: Self-pay | Admitting: Adult Health

## 2021-02-23 DIAGNOSIS — Z853 Personal history of malignant neoplasm of breast: Secondary | ICD-10-CM

## 2021-02-24 ENCOUNTER — Ambulatory Visit
Admission: RE | Admit: 2021-02-24 | Discharge: 2021-02-24 | Disposition: A | Discharge status: Home / Self Care | Payer: Medicare Other | Source: Ambulatory Visit | Attending: Family Medicine | Admitting: Family Medicine

## 2021-02-24 ENCOUNTER — Other Ambulatory Visit: Payer: Self-pay

## 2021-02-24 DIAGNOSIS — R922 Inconclusive mammogram: Secondary | ICD-10-CM | POA: Diagnosis not present

## 2021-02-24 DIAGNOSIS — Z853 Personal history of malignant neoplasm of breast: Secondary | ICD-10-CM

## 2021-03-17 ENCOUNTER — Other Ambulatory Visit: Payer: Self-pay | Admitting: Hematology and Oncology

## 2021-03-17 DIAGNOSIS — D0511 Intraductal carcinoma in situ of right breast: Secondary | ICD-10-CM

## 2021-03-20 DIAGNOSIS — E1165 Type 2 diabetes mellitus with hyperglycemia: Secondary | ICD-10-CM | POA: Diagnosis not present

## 2021-03-20 DIAGNOSIS — I10 Essential (primary) hypertension: Secondary | ICD-10-CM | POA: Diagnosis not present

## 2021-03-20 DIAGNOSIS — E7849 Other hyperlipidemia: Secondary | ICD-10-CM | POA: Diagnosis not present

## 2021-03-20 DIAGNOSIS — Z7984 Long term (current) use of oral hypoglycemic drugs: Secondary | ICD-10-CM | POA: Diagnosis not present

## 2021-04-18 NOTE — Assessment & Plan Note (Signed)
03/18/2019: Screening mammogram detected indeterminate groups of calcifications 1.5 cm and 0.8 cm biopsy initially showed LCIS.  Right lumpectomy Dalbert Batman): DCIS, intermediate grade, focally involving a small intraductal papilloma, clear margins, no invasive carcinoma, ER+ 100%, PR+ 50 Adjuvant radiation 05/01/2019-05/23/2019  Treatment plan: Tamoxifen 20 mg daily x5 years Tamoxifen toxicities:  Breast cancer surveillance: 1.  Mammogram 02/02/2020: Benign breast density category B 2. breast exam 11/04/2020: Benign 3.  Bone density 02/02/2020: T score -1.4: Mild osteopenia

## 2021-04-18 NOTE — Progress Notes (Signed)
Patient Care Team: [name], MD as PCP - General (Family Medicine) Danie Binder, MD (Inactive) as Consulting Physician (Gastroenterology) Nicholas Lose, MD as Consulting Physician (Hematology and Oncology) Gery Pray, MD as Consulting Physician (Radiation Oncology) Fanny Skates, MD as Consulting Physician (General Surgery)  DIAGNOSIS:    ICD-10-CM   1. Ductal carcinoma in situ (DCIS) of right breast  D05.11       SUMMARY OF ONCOLOGIC HISTORY: Oncology History  Ductal carcinoma in situ (DCIS) of right breast  02/19/2019 Initial Diagnosis   Routine screening mammogram detected two indeterminate groups of calcifications in the upper right breast measuring 20m and 816m Biopsy ([id number] LCIS with calcifications.    03/18/2019 Surgery   Right lumpectomy (IDalbert Batman([id number] DCIS, intermediate grade, focally involving a small intraductal papilloma, clear margins, no invasive carcinoma, ER+ 100%, PR+ 50%.    03/18/2019 Cancer Staging   Staging form: Breast, AJCC 8th Edition - Clinical stage from 03/18/2019: Stage 0 (cTis (DCIS), cN0, cM0, ER+, PR+) - Signed by CaGardenia PhlegmNP on 08/13/2019   03/18/2019 Cancer Staging   Staging form: Breast, AJCC 8th Edition - Pathologic stage from 03/18/2019: Stage 0 (pTis (DCIS), pN0, cM0, ER+, PR+) - Signed by CaGardenia PhlegmNP on 08/13/2019   04/30/2019 - 05/27/2019 Radiation Therapy   Adjuvant radiation therapy The whole right breast was treated to 40.05 Gy in 15 fractions. This was followed by a boost to the seroma of 10 Gy in 5 fractions, for a total dose of 50.04 Gy in 20 fractions.   05/2019 -  Anti-estrogen oral therapy   Tamoxifen 20 mg     CHIEF COMPLIANT: Follow-up of right breast DCIS on tamoxifen  INTERVAL HISTORY: [name] a [age].o. with above-mentioned history of right breast DCIS who underwent a lumpectomy, radiation, and is currently on antiestrogen therapy with  tamoxifen. Mammogram on 02/24/2021 showed no evidence of malignancy. She presents to the clinic today for follow-up.  She is tolerating tamoxifen extremely well without any problems or concerns but denies any lumps or nodules in the breast.  ALLERGIES:  has No Known Allergies.  MEDICATIONS:  Current Outpatient Medications  Medication Sig Dispense Refill   aspirin 81 MG tablet Take 81 mg by mouth daily.     calcium carbonate (CALTRATE 600) 1500 (600 Ca) MG TABS tablet Take 1 tablet (1,500 mg total) by mouth daily with breakfast.  0   Cholecalciferol (VITAMIN D) 50 MCG (2000 UT) CAPS Take 1 capsule (2,000 Units total) by mouth daily. 30 capsule    Fluocinolone Acetonide Scalp 0.01 % OIL      gentamicin cream (GARAMYCIN) 0.1 % Apply 1 application topically 2 (two) times daily. 30 g 1   Lancets (ONETOUCH DELICA PLUS [id number] Apply topically as directed.     metFORMIN (GLUCOPHAGE) 500 MG tablet Take 1 tablet (500 mg total) by mouth daily with breakfast.     pravastatin (PRAVACHOL) 40 MG tablet Take 40 mg by mouth daily.     spironolactone-hydrochlorothiazide (ALDACTAZIDE) 25-25 MG per tablet Take 1 tablet by mouth daily.      tamoxifen (NOLVADEX) 20 MG tablet TAKE 1 TABLET BY MOUTH  DAILY 90 tablet 3   terbinafine (LAMISIL) 250 MG tablet Take 1 tablet (250 mg total) by mouth daily. 90 tablet 0   No current facility-administered medications for this visit.    PHYSICAL EXAMINATION: ECOG PERFORMANCE STATUS: 1 - Symptomatic but completely ambulatory  Vitals:   04/19/21 1015  BP: (!) 137/55  Pulse: 73  Resp: 16  Temp: 98.1 F (36.7 C)  SpO2: 97%   Filed Weights   04/19/21 1015  Weight: 234 lb 1.6 oz (106.2 kg)    BREAST: No palpable masses or nodules in either right or left breasts. No palpable axillary supraclavicular or infraclavicular adenopathy no breast tenderness or nipple discharge. (exam performed in the presence of a chaperone)  LABORATORY DATA:  I have reviewed the  data as listed CMP Latest Ref Rng & Units 10/28/2020 03/14/2019  Glucose 70 - 99 mg/dL - 106(H)  BUN 8 - 23 mg/dL - 23  Creatinine 0.44 - 1.00 mg/dL - 1.34(H)  Sodium 135 - 145 mmol/L - 142  Potassium 3.5 - 5.1 mmol/L - 3.5  Chloride 98 - 111 mmol/L - 101  CO2 22 - 32 mmol/L - 29  Calcium 8.9 - 10.3 mg/dL - 9.8  Total Protein 6.0 - 8.5 g/dL 7.3 7.4  Total Bilirubin 0.0 - 1.2 mg/dL <0.2 0.6  Alkaline Phos 44 - 121 IU/L 61 71  AST 0 - 40 IU/L 18 19  ALT 0 - 32 IU/L 15 17    Lab Results  Component Value Date   WBC 6.2 03/14/2019   HGB 12.3 03/14/2019   HCT 37.9 03/14/2019   MCV 90.0 03/14/2019   PLT 206 03/14/2019   NEUTROABS 4.1 03/14/2019    ASSESSMENT & PLAN:  Ductal carcinoma in situ (DCIS) of right breast 03/18/2019: Screening mammogram detected indeterminate groups of calcifications 1.5 cm and 0.8 cm biopsy initially showed LCIS.   Right lumpectomy Dalbert Batman): DCIS, intermediate grade, focally involving a small intraductal papilloma, clear margins, no invasive carcinoma, ER+ 100%, PR+ 50 Adjuvant radiation 05/01/2019-05/23/2019   Treatment plan: Tamoxifen 20 mg daily x5 years started 06/10/2019 Tamoxifen toxicities:   Breast cancer surveillance: 1.  Mammogram 02/02/2020: Benign breast density category B 2. breast exam 11/04/2020: Benign 3.  Bone density 02/02/2020: T score -1.4: Mild osteopenia  Return to clinic in 1 year for follow-up  No orders of the defined types were placed in this encounter.  The patient has a good understanding of the overall plan. she agrees with it. she will call with any problems that may develop before the next visit here.  Total time spent: 20 mins including face to face time and time spent for planning, charting and coordination of care  Sara Eisenmenger, MD, MPH 04/19/2021  I, Thana Ates, am acting as scribe for Dr. Nicholas Lose.  I have reviewed the above documentation for accuracy and completeness, and I agree with the  above.

## 2021-04-19 ENCOUNTER — Inpatient Hospital Stay: Payer: Medicare Other | Attending: Hematology and Oncology | Admitting: Hematology and Oncology

## 2021-04-19 ENCOUNTER — Other Ambulatory Visit: Payer: Self-pay

## 2021-04-19 ENCOUNTER — Ambulatory Visit: Payer: Medicare Other | Admitting: Hematology and Oncology

## 2021-04-19 DIAGNOSIS — Z79899 Other long term (current) drug therapy: Secondary | ICD-10-CM | POA: Insufficient documentation

## 2021-04-19 DIAGNOSIS — D0511 Intraductal carcinoma in situ of right breast: Secondary | ICD-10-CM | POA: Diagnosis not present

## 2021-04-19 DIAGNOSIS — Z79810 Long term (current) use of selective estrogen receptor modulators (SERMs): Secondary | ICD-10-CM | POA: Insufficient documentation

## 2021-04-20 DIAGNOSIS — E1165 Type 2 diabetes mellitus with hyperglycemia: Secondary | ICD-10-CM | POA: Diagnosis not present

## 2021-04-20 DIAGNOSIS — I10 Essential (primary) hypertension: Secondary | ICD-10-CM | POA: Diagnosis not present

## 2021-04-20 DIAGNOSIS — E7849 Other hyperlipidemia: Secondary | ICD-10-CM | POA: Diagnosis not present

## 2021-04-20 DIAGNOSIS — Z7984 Long term (current) use of oral hypoglycemic drugs: Secondary | ICD-10-CM | POA: Diagnosis not present

## 2021-05-20 DIAGNOSIS — I10 Essential (primary) hypertension: Secondary | ICD-10-CM | POA: Diagnosis not present

## 2021-05-20 DIAGNOSIS — E7849 Other hyperlipidemia: Secondary | ICD-10-CM | POA: Diagnosis not present

## 2021-05-20 DIAGNOSIS — Z7984 Long term (current) use of oral hypoglycemic drugs: Secondary | ICD-10-CM | POA: Diagnosis not present

## 2021-05-20 DIAGNOSIS — E1165 Type 2 diabetes mellitus with hyperglycemia: Secondary | ICD-10-CM | POA: Diagnosis not present

## 2021-05-30 ENCOUNTER — Ambulatory Visit: Payer: Medicare Other | Admitting: Podiatry

## 2021-05-30 ENCOUNTER — Other Ambulatory Visit: Payer: Self-pay

## 2021-05-30 DIAGNOSIS — N183 Chronic kidney disease, stage 3 unspecified: Secondary | ICD-10-CM | POA: Diagnosis not present

## 2021-05-30 DIAGNOSIS — M79674 Pain in right toe(s): Secondary | ICD-10-CM | POA: Diagnosis not present

## 2021-05-30 DIAGNOSIS — E1122 Type 2 diabetes mellitus with diabetic chronic kidney disease: Secondary | ICD-10-CM | POA: Diagnosis not present

## 2021-05-30 DIAGNOSIS — M79675 Pain in left toe(s): Secondary | ICD-10-CM | POA: Diagnosis not present

## 2021-05-30 DIAGNOSIS — E1165 Type 2 diabetes mellitus with hyperglycemia: Secondary | ICD-10-CM | POA: Diagnosis not present

## 2021-05-30 DIAGNOSIS — E1169 Type 2 diabetes mellitus with other specified complication: Secondary | ICD-10-CM | POA: Diagnosis not present

## 2021-05-30 DIAGNOSIS — Z23 Encounter for immunization: Secondary | ICD-10-CM | POA: Diagnosis not present

## 2021-05-30 DIAGNOSIS — I10 Essential (primary) hypertension: Secondary | ICD-10-CM | POA: Diagnosis not present

## 2021-05-30 DIAGNOSIS — B351 Tinea unguium: Secondary | ICD-10-CM

## 2021-05-30 DIAGNOSIS — E785 Hyperlipidemia, unspecified: Secondary | ICD-10-CM | POA: Diagnosis not present

## 2021-06-12 NOTE — Progress Notes (Signed)
   HPI: 70 y.o. female presenting today for follow-up evaluation of onychomycosis of the toenails.  Patient completed the oral Lamisil with no problems.  Patient states that the nails continue to improve.  No new complaints at this time  Past Medical History:  Diagnosis Date   Diabetes mellitus without complication ([place])    Hyperlipidemia    Hypertension    Lobular carcinoma in situ (LCIS) of right breast 03/18/2019   Obesity    Personal history of radiation therapy    Stasis dermatitis    Venous insufficiency      Physical Exam: General: The patient is alert and oriented x3 in no acute distress.  Dermatology: Skin is warm, dry and supple bilateral lower extremities. Negative for open lesions or macerations. The bases of the nail plates appear to demonstrate good healthy regrowth of the nail.  There continues to be some dystrophy to the distal portion of the nail  Vascular: Palpable pedal pulses bilaterally. No edema or erythema noted. Capillary refill within normal limits.  Neurological: Epicritic and protective threshold grossly intact bilaterally.   Musculoskeletal Exam: No pedal deformities  Assessment: 1.  Onychomycosis of toenails   Plan of Care:  1. Patient evaluated.  2.  I explained to the patient that the nails are growing out healthy.  Recommend that she continues to maintain good foot hygiene and allow the nails to regrow 3.  Return to clinic as needed      Edrick Kins, DPM [facility]  Dr. Edrick Kins, DPM    [address], [place] [address]                Office [phone]  Fax [phone]

## 2021-06-20 DIAGNOSIS — I10 Essential (primary) hypertension: Secondary | ICD-10-CM | POA: Diagnosis not present

## 2021-06-20 DIAGNOSIS — E7849 Other hyperlipidemia: Secondary | ICD-10-CM | POA: Diagnosis not present

## 2021-06-20 DIAGNOSIS — E1165 Type 2 diabetes mellitus with hyperglycemia: Secondary | ICD-10-CM | POA: Diagnosis not present

## 2021-06-20 DIAGNOSIS — Z7984 Long term (current) use of oral hypoglycemic drugs: Secondary | ICD-10-CM | POA: Diagnosis not present

## 2021-07-13 DIAGNOSIS — E1169 Type 2 diabetes mellitus with other specified complication: Secondary | ICD-10-CM | POA: Diagnosis not present

## 2021-07-20 DIAGNOSIS — E7849 Other hyperlipidemia: Secondary | ICD-10-CM | POA: Diagnosis not present

## 2021-07-20 DIAGNOSIS — I10 Essential (primary) hypertension: Secondary | ICD-10-CM | POA: Diagnosis not present

## 2021-07-20 DIAGNOSIS — E1165 Type 2 diabetes mellitus with hyperglycemia: Secondary | ICD-10-CM | POA: Diagnosis not present

## 2021-08-19 DIAGNOSIS — I10 Essential (primary) hypertension: Secondary | ICD-10-CM | POA: Diagnosis not present

## 2021-08-19 DIAGNOSIS — E7849 Other hyperlipidemia: Secondary | ICD-10-CM | POA: Diagnosis not present

## 2021-08-19 DIAGNOSIS — E1165 Type 2 diabetes mellitus with hyperglycemia: Secondary | ICD-10-CM | POA: Diagnosis not present

## 2021-08-19 DIAGNOSIS — Z7984 Long term (current) use of oral hypoglycemic drugs: Secondary | ICD-10-CM | POA: Diagnosis not present

## 2021-08-30 DIAGNOSIS — E785 Hyperlipidemia, unspecified: Secondary | ICD-10-CM | POA: Diagnosis not present

## 2021-08-30 DIAGNOSIS — I10 Essential (primary) hypertension: Secondary | ICD-10-CM | POA: Diagnosis not present

## 2021-08-30 DIAGNOSIS — N183 Chronic kidney disease, stage 3 unspecified: Secondary | ICD-10-CM | POA: Diagnosis not present

## 2021-08-30 DIAGNOSIS — E1122 Type 2 diabetes mellitus with diabetic chronic kidney disease: Secondary | ICD-10-CM | POA: Diagnosis not present

## 2021-10-31 ENCOUNTER — Telehealth: Payer: Self-pay | Admitting: Adult Health

## 2021-10-31 NOTE — Telephone Encounter (Signed)
Rescheduled appointment per provider template. Patient is aware of the changes made to her appointment. ?

## 2021-11-04 ENCOUNTER — Inpatient Hospital Stay: Payer: Medicare Other | Admitting: Adult Health

## 2021-11-07 ENCOUNTER — Encounter: Payer: Self-pay | Admitting: Adult Health

## 2021-11-07 ENCOUNTER — Other Ambulatory Visit: Payer: Self-pay

## 2021-11-07 ENCOUNTER — Inpatient Hospital Stay: Payer: Medicare Other | Attending: Adult Health | Admitting: Adult Health

## 2021-11-07 VITALS — BP 144/85 | HR 85 | Temp 97.7°F | Resp 18 | Ht 65.0 in | Wt 231.8 lb

## 2021-11-07 DIAGNOSIS — N951 Menopausal and female climacteric states: Secondary | ICD-10-CM | POA: Diagnosis not present

## 2021-11-07 DIAGNOSIS — D0511 Intraductal carcinoma in situ of right breast: Secondary | ICD-10-CM | POA: Diagnosis not present

## 2021-11-07 DIAGNOSIS — M8580 Other specified disorders of bone density and structure, unspecified site: Secondary | ICD-10-CM | POA: Insufficient documentation

## 2021-11-07 NOTE — Assessment & Plan Note (Signed)
03/18/2019: Screening mammogram detected indeterminate groups of calcifications 1.5 cm and 0.8 cm biopsy initially showed LCIS. ? ?Right lumpectomy Dalbert Batman): DCIS, intermediate grade, focally involving a small intraductal papilloma, clear margins, no invasive carcinoma, ER+ 100%, PR+ 50 ?Adjuvant radiation 05/01/2019-05/23/2019 ?? ?Treatment plan: Tamoxifen 20 mg daily x5 years ?Tamoxifen toxicities: Manageable hot flashes ?? ?Breast cancer surveillance: ?1.  Mammogram 02/2021: Benign breast density category B ?2. breast exam 11/07/2020: Benign ?3.  Bone density 02/02/2020: T score -1.4: Mild osteopenia ? ?[name] is doing well today.  She has no clinical or radiographic sign of breast cancer recurrence.  She is tolerating tamoxifen well and will continue this daily. ? ?We discussed healthy diet and exercise.  I recommended that she stay up-to-date with her cancer screenings and follow-up with her primary care provider.  We will see her back in 6 months for follow-up with Dr. Lindi Adie. ? ? ?

## 2021-11-07 NOTE — Progress Notes (Signed)
[name] Follow up: ?  ? ?Sara Beard, MD ?[address] ?[place] [address] ? ? ?DIAGNOSIS:  Romero Staging  ?Ductal carcinoma in situ (DCIS) of right breast ?Staging form: Breast, AJCC 8th Edition ?- Clinical stage from 03/18/2019: Stage 0 (cTis (DCIS), cN0, cM0, ER+, PR+) - Signed by Gardenia Phlegm, NP on 08/13/2019 ?Stage prefix: Initial diagnosis ?- Pathologic stage from 03/18/2019: Stage 0 (pTis (DCIS), pN0, cM0, ER+, PR+) - Signed by Gardenia Phlegm, NP on 08/13/2019 ? ? ?SUMMARY OF ONCOLOGIC HISTORY: ?Oncology History  ?Ductal carcinoma in situ (DCIS) of right breast  ?02/19/2019 Initial Diagnosis  ? Routine screening mammogram detected two indeterminate groups of calcifications in the upper right breast measuring 75m and 868m Biopsy ([id number] LCIS with calcifications.  ?  ?03/18/2019 Surgery  ? Right lumpectomy (IDalbert Batman([id number] DCIS, intermediate grade, focally involving a small intraductal papilloma, clear margins, no invasive carcinoma, ER+ 100%, PR+ 50%.  ?  ?03/18/2019 Romero Staging  ? Staging form: Breast, AJCC 8th Edition ?- Clinical stage from 03/18/2019: Stage 0 (cTis (DCIS), cN0, cM0, ER+, PR+) - Signed by CaGardenia PhlegmNP on 08/13/2019 ? ?  ?03/18/2019 Romero Staging  ? Staging form: Breast, AJCC 8th Edition ?- Pathologic stage from 03/18/2019: Stage 0 (pTis (DCIS), pN0, cM0, ER+, PR+) - Signed by CaGardenia PhlegmNP on 08/13/2019 ? ?  ?04/30/2019 - 05/27/2019 Radiation Therapy  ? Adjuvant radiation therapy ?The whole right breast was treated to 40.05 Gy in 15 fractions. This was followed by a boost to the seroma of 10 Gy in 5 fractions, for a total dose of 50.04 Gy in 20 fractions. ?  ?05/2019 -  Anti-estrogen oral therapy  ? Tamoxifen 20 mg ?  ? ? ?CURRENT THERAPY: Tamoxifen ? ?INTERVAL HISTORY: ?BrMerrillyn Ackerley89.o. female returns for follow-up of her history of breast Romero.  She continues on tamoxifen  with good tolerance.  Her most recent mammogram was completed on February 24, 2021 and showed no evidence of malignancy and breast density category B. ? ?She is taking Tamoxifen daily.  She has intermittent hot flashes, however these are manageable.  She denies any other significant changes since we saw her last year.  She remains active.  Her sister has been in the hospital recently so she has spent more time helping take care of her.  This is not causing her any undue stress. ? ? ?Patient Active Problem List  ? Diagnosis Date Noted  ? Ductal carcinoma in situ (DCIS) of right breast 03/18/2019  ? Lymphedema 03/28/2013  ? HYPERLIPIDEMIA-MIXED 02/08/2009  ? HYPERTENSION, MALIGNANT, UNCONTROLLED 02/08/2009  ? VENOUS INSUFFICIENCY, CHRONIC 02/08/2009  ? EDEMA 02/08/2009  ? CHEST PAIN-UNSPECIFIED 02/08/2009  ? ? ?has No Known Allergies. ? ?MEDICAL HISTORY: ?Past Medical History:  ?Diagnosis Date  ? Diabetes mellitus without complication ([place]  ? Hyperlipidemia   ? Hypertension   ? Lobular carcinoma in situ (LCIS) of right breast 03/18/2019  ? Obesity   ? Personal history of radiation therapy   ? Stasis dermatitis   ? Venous insufficiency   ? ? ?SURGICAL HISTORY: ?Past Surgical History:  ?Procedure Laterality Date  ? ABDOMINAL HYSTERECTOMY    ? BREAST LUMPECTOMY Right 03/18/2019  ? BREAST LUMPECTOMY WITH RADIOACTIVE SEED LOCALIZATION Right 03/18/2019  ? Procedure: RIGHT BREAST LUMPECTOMY X 2  WITH RIGHT RADIOACTIVE SEED LOCALIZATION X 2;  Surgeon: InFanny SkatesMD;  [facility]: [facility];  Laterality: Right;  ? CHOLECYSTECTOMY    ? COLONOSCOPY N/A 07/02/2013  ? Procedure: COLONOSCOPY;  Surgeon: Danie Binder, MD;  Location[facility];  Service: Endoscopy;  Laterality: N/A;  9:15 AM  ? ? ?SOCIAL HISTORY: ?Social History  ? ?Socioeconomic History  ? Marital status: Married  ?  Spouse name: Not on file  ? Number of children: Not on file  ? Years of education: Not on file  ? Highest  education level: Not on file  ?Occupational History  ? Not on file  ?Tobacco Use  ? Smoking status: Never  ? Smokeless tobacco: Never  ?Substance and Sexual Activity  ? Alcohol use: No  ? Drug use: No  ? Sexual activity: Not on file  ?Other Topics Concern  ? Not on file  ?Social History Narrative  ? Not on file  ? ?Social Determinants of Health  ? ?Financial Resource Strain: Not on file  ?Food Insecurity: Not on file  ?Transportation Needs: Not on file  ?Physical Activity: Not on file  ?Stress: Not on file  ?Social Connections: Not on file  ?Intimate Partner Violence: Not on file  ? ? ?FAMILY HISTORY: ?Family History  ?Problem Relation Age of Onset  ? Diabetes Mother   ? Heart disease Mother   ? Hypertension Mother   ? Romero Father   ? [place] Romero Neg Hx   ? ? ?Review of Systems  ?Constitutional:  Negative for appetite change, chills, fatigue, fever and unexpected weight change.  ?HENT:   Negative for hearing loss, lump/mass and trouble swallowing.   ?Eyes:  Negative for eye problems and icterus.  ?Respiratory:  Negative for chest tightness, cough and shortness of breath.   ?Cardiovascular:  Negative for chest pain, leg swelling and palpitations.  ?Gastrointestinal:  Negative for abdominal distention, abdominal pain, constipation, diarrhea, nausea and vomiting.  ?Endocrine: Negative for hot flashes.  ?Genitourinary:  Negative for difficulty urinating.   ?Musculoskeletal:  Negative for arthralgias.  ?Skin:  Negative for itching and rash.  ?Neurological:  Negative for dizziness, extremity weakness, headaches and numbness.  ?Hematological:  Negative for adenopathy. Does not bruise/bleed easily.  ?Psychiatric/Behavioral:  Negative for depression. The patient is not nervous/anxious.    ? ? ?PHYSICAL EXAMINATION ? ?ECOG PERFORMANCE STATUS: 1 - Symptomatic but completely ambulatory ? ?Vitals:  ? 11/07/21 1332  ?BP: (!) 144/85  ?Pulse: 85  ?Resp: 18  ?Temp: 97.7 ?F (36.5 ?C)  ?SpO2: 98%  ? ? ?Physical  Exam ?Constitutional:   ?   General: She is not in acute distress. ?   Appearance: Normal appearance. She is not toxic-appearing.  ?HENT:  ?   Head: Normocephalic and atraumatic.  ?Eyes:  ?   General: No scleral icterus. ?Cardiovascular:  ?   Rate and Rhythm: Normal rate and regular rhythm.  ?   Pulses: Normal pulses.  ?   Heart sounds: Normal heart sounds.  ?Pulmonary:  ?   Effort: Pulmonary effort is normal.  ?   Breath sounds: Normal breath sounds.  ?Chest:  ?   Comments: Right breast status postlumpectomy and radiation no sign of local recurrence left breast is benign. ?Abdominal:  ?   General: Abdomen is flat. Bowel sounds are normal. There is no distension.  ?   Palpations: Abdomen is soft.  ?   Tenderness: There is no abdominal tenderness.  ?Musculoskeletal:     ?   General: No swelling.  ?   Cervical back: Neck supple.  ?Lymphadenopathy:  ?  Cervical: No cervical adenopathy.  ?Skin: ?   General: Skin is warm and dry.  ?   Findings: No rash.  ?Neurological:  ?   General: No focal deficit present.  ?   Mental Status: She is alert.  ?Psychiatric:     ?   Mood and Affect: Mood normal.     ?   Behavior: Behavior normal.  ? ? ?LABORATORY DATA: ? ?CBC ?   ?Component Value Date/Time  ? WBC 6.2 03/14/2019 1200  ? RBC 4.21 03/14/2019 1200  ? HGB 12.3 03/14/2019 1200  ? HCT 37.9 03/14/2019 1200  ? PLT 206 03/14/2019 1200  ? MCV 90.0 03/14/2019 1200  ? MCH 29.2 03/14/2019 1200  ? MCHC 32.5 03/14/2019 1200  ? RDW 13.2 03/14/2019 1200  ? LYMPHSABS 1.4 03/14/2019 1200  ? MONOABS 0.4 03/14/2019 1200  ? EOSABS 0.3 03/14/2019 1200  ? BASOSABS 0.0 03/14/2019 1200  ? ? ?CMP  ?   ?Component Value Date/Time  ? NA 142 03/14/2019 1200  ? K 3.5 03/14/2019 1200  ? CL 101 03/14/2019 1200  ? CO2 29 03/14/2019 1200  ? GLUCOSE 106 (H) 03/14/2019 1200  ? BUN 23 03/14/2019 1200  ? CREATININE 1.34 (H) 03/14/2019 1200  ? CALCIUM 9.8 03/14/2019 1200  ? PROT 7.3 10/28/2020 1356  ? ALBUMIN 4.0 10/28/2020 1356  ? AST 18 10/28/2020 1356  ? ALT  15 10/28/2020 1356  ? ALKPHOS 61 10/28/2020 1356  ? BILITOT <0.2 10/28/2020 1356  ? GFRNONAA 40 (L) 03/14/2019 1200  ? GFRAA 47 (L) 03/14/2019 1200  ? ? ?ASSESSMENT and THERAPY PLAN:  ? ?Ductal carcinoma in situ (DCIS) of right b

## 2021-11-08 ENCOUNTER — Telehealth: Payer: Self-pay | Admitting: Adult Health

## 2021-11-08 NOTE — Telephone Encounter (Signed)
Scheduled appointment per 3/20 los. Patient is aware. ?

## 2021-12-27 DIAGNOSIS — E785 Hyperlipidemia, unspecified: Secondary | ICD-10-CM | POA: Diagnosis not present

## 2021-12-27 DIAGNOSIS — N183 Chronic kidney disease, stage 3 unspecified: Secondary | ICD-10-CM | POA: Diagnosis not present

## 2021-12-27 DIAGNOSIS — E1169 Type 2 diabetes mellitus with other specified complication: Secondary | ICD-10-CM | POA: Diagnosis not present

## 2021-12-27 DIAGNOSIS — I10 Essential (primary) hypertension: Secondary | ICD-10-CM | POA: Diagnosis not present

## 2021-12-27 DIAGNOSIS — M1711 Unilateral primary osteoarthritis, right knee: Secondary | ICD-10-CM | POA: Diagnosis not present

## 2021-12-27 DIAGNOSIS — I872 Venous insufficiency (chronic) (peripheral): Secondary | ICD-10-CM | POA: Diagnosis not present

## 2021-12-27 DIAGNOSIS — Z0000 Encounter for general adult medical examination without abnormal findings: Secondary | ICD-10-CM | POA: Diagnosis not present

## 2022-01-23 ENCOUNTER — Other Ambulatory Visit: Payer: Self-pay | Admitting: *Deleted

## 2022-01-23 DIAGNOSIS — D0511 Intraductal carcinoma in situ of right breast: Secondary | ICD-10-CM

## 2022-01-23 DIAGNOSIS — M8580 Other specified disorders of bone density and structure, unspecified site: Secondary | ICD-10-CM

## 2022-01-24 DIAGNOSIS — N183 Chronic kidney disease, stage 3 unspecified: Secondary | ICD-10-CM | POA: Diagnosis not present

## 2022-01-24 DIAGNOSIS — E1122 Type 2 diabetes mellitus with diabetic chronic kidney disease: Secondary | ICD-10-CM | POA: Diagnosis not present

## 2022-01-24 DIAGNOSIS — E1169 Type 2 diabetes mellitus with other specified complication: Secondary | ICD-10-CM | POA: Diagnosis not present

## 2022-01-24 DIAGNOSIS — I10 Essential (primary) hypertension: Secondary | ICD-10-CM | POA: Diagnosis not present

## 2022-02-14 ENCOUNTER — Other Ambulatory Visit: Payer: Self-pay | Admitting: Hematology and Oncology

## 2022-02-14 DIAGNOSIS — D0511 Intraductal carcinoma in situ of right breast: Secondary | ICD-10-CM

## 2022-02-27 ENCOUNTER — Ambulatory Visit
Admission: RE | Admit: 2022-02-27 | Discharge: 2022-02-27 | Disposition: A | Discharge status: Home / Self Care | Payer: Medicare Other | Source: Ambulatory Visit | Attending: Adult Health | Admitting: Adult Health

## 2022-02-27 ENCOUNTER — Other Ambulatory Visit: Payer: Self-pay | Admitting: Adult Health

## 2022-02-27 DIAGNOSIS — R928 Other abnormal and inconclusive findings on diagnostic imaging of breast: Secondary | ICD-10-CM | POA: Diagnosis not present

## 2022-02-27 DIAGNOSIS — Z853 Personal history of malignant neoplasm of breast: Secondary | ICD-10-CM

## 2022-02-27 DIAGNOSIS — D0511 Intraductal carcinoma in situ of right breast: Secondary | ICD-10-CM

## 2022-03-21 ENCOUNTER — Other Ambulatory Visit (HOSPITAL_COMMUNITY): Payer: Self-pay | Admitting: Family Medicine

## 2022-03-21 ENCOUNTER — Ambulatory Visit (HOSPITAL_COMMUNITY)
Admission: RE | Admit: 2022-03-21 | Discharge: 2022-03-21 | Disposition: A | Discharge status: Home / Self Care | Payer: Medicare Other | Source: Ambulatory Visit | Attending: Family Medicine | Admitting: Family Medicine

## 2022-03-21 DIAGNOSIS — I10 Essential (primary) hypertension: Secondary | ICD-10-CM | POA: Diagnosis not present

## 2022-03-21 DIAGNOSIS — M25561 Pain in right knee: Secondary | ICD-10-CM | POA: Insufficient documentation

## 2022-03-21 DIAGNOSIS — E1169 Type 2 diabetes mellitus with other specified complication: Secondary | ICD-10-CM | POA: Diagnosis not present

## 2022-03-21 DIAGNOSIS — M1711 Unilateral primary osteoarthritis, right knee: Secondary | ICD-10-CM | POA: Diagnosis not present

## 2022-04-04 DIAGNOSIS — I10 Essential (primary) hypertension: Secondary | ICD-10-CM | POA: Diagnosis not present

## 2022-04-05 ENCOUNTER — Ambulatory Visit: Payer: Medicare Other | Admitting: Orthopaedic Surgery

## 2022-04-10 DIAGNOSIS — E119 Type 2 diabetes mellitus without complications: Secondary | ICD-10-CM | POA: Diagnosis not present

## 2022-04-10 DIAGNOSIS — H40033 Anatomical narrow angle, bilateral: Secondary | ICD-10-CM | POA: Diagnosis not present

## 2022-04-14 DIAGNOSIS — M25561 Pain in right knee: Secondary | ICD-10-CM | POA: Diagnosis not present

## 2022-04-18 ENCOUNTER — Ambulatory Visit: Payer: Medicare Other | Admitting: Orthopedic Surgery

## 2022-04-19 ENCOUNTER — Ambulatory Visit: Payer: Medicare Other | Admitting: Hematology and Oncology

## 2022-05-10 NOTE — Progress Notes (Signed)
Patient Care Team: [name], MD as PCP - General (Family Medicine) Nicholas Lose, MD as Consulting Physician (Hematology and Oncology)  DIAGNOSIS: No diagnosis found.  SUMMARY OF ONCOLOGIC HISTORY: Oncology History  Ductal carcinoma in situ (DCIS) of right breast  02/19/2019 Initial Diagnosis   Routine screening mammogram detected two indeterminate groups of calcifications in the upper right breast measuring 41m and 881m Biopsy (S([id number] LCIS with calcifications.    03/18/2019 Surgery   Right lumpectomy (IDalbert Batman([id number] DCIS, intermediate grade, focally involving a small intraductal papilloma, clear margins, no invasive carcinoma, ER+ 100%, PR+ 50%.    03/18/2019 Cancer Staging   Staging form: Breast, AJCC 8th Edition - Clinical stage from 03/18/2019: Stage 0 (cTis (DCIS), cN0, cM0, ER+, PR+) - Signed by CaGardenia PhlegmNP on 08/13/2019   03/18/2019 Cancer Staging   Staging form: Breast, AJCC 8th Edition - Pathologic stage from 03/18/2019: Stage 0 (pTis (DCIS), pN0, cM0, ER+, PR+) - Signed by CaGardenia PhlegmNP on 08/13/2019   04/30/2019 - 05/27/2019 Radiation Therapy   Adjuvant radiation therapy The whole right breast was treated to 40.05 Gy in 15 fractions. This was followed by a boost to the seroma of 10 Gy in 5 fractions, for a total dose of 50.04 Gy in 20 fractions.   05/2019 -  Anti-estrogen oral therapy   Tamoxifen 20 mg     CHIEF COMPLIANT:   INTERVAL HISTORY: [name] a   ALLERGIES:  has No Known Allergies.  MEDICATIONS:  Current Outpatient Medications  Medication Sig Dispense Refill   aspirin 81 MG tablet Take 81 mg by mouth daily.     calcium carbonate (CALTRATE 600) 1500 (600 Ca) MG TABS tablet Take 1 tablet (1,500 mg total) by mouth daily with breakfast.  0   Cholecalciferol (VITAMIN D) 50 MCG (2000 UT) CAPS Take 1 capsule (2,000 Units total) by mouth daily. 30 capsule    Lancets (ONETOUCH DELICA PLUS  [id number] Apply topically as directed.     metFORMIN (GLUCOPHAGE) 500 MG tablet Take 1 tablet (500 mg total) by mouth daily with breakfast.     pravastatin (PRAVACHOL) 40 MG tablet Take 40 mg by mouth daily.     spironolactone-hydrochlorothiazide (ALDACTAZIDE) 25-25 MG per tablet Take 1 tablet by mouth daily.      tamoxifen (NOLVADEX) 20 MG tablet TAKE 1 TABLET BY MOUTH  DAILY 90 tablet 3   No current facility-administered medications for this visit.    PHYSICAL EXAMINATION: ECOG PERFORMANCE STATUS: {CHL ONC ECOG PS:[id number]}  There were no vitals filed for this visit. There were no vitals filed for this visit.  BREAST:*** No palpable masses or nodules in either right or left breasts. No palpable axillary supraclavicular or infraclavicular adenopathy no breast tenderness or nipple discharge. (exam performed in the presence of a chaperone)  LABORATORY DATA:  I have reviewed the data as listed    Latest Ref Rng & Units 10/28/2020    1:56 PM 03/14/2019   12:00 PM  CMP  Glucose 70 - 99 mg/dL  106   BUN 8 - 23 mg/dL  23   Creatinine 0.44 - 1.00 mg/dL  1.34   Sodium 135 - 145 mmol/L  142   Potassium 3.5 - 5.1 mmol/L  3.5   Chloride 98 - 111 mmol/L  101   CO2 22 - 32 mmol/L  29   Calcium 8.9 - 10.3 mg/dL  9.8   Total Protein 6.0 - 8.5 g/dL 7.3  7.4  Total Bilirubin 0.0 - 1.2 mg/dL <0.2  0.6   Alkaline Phos 44 - 121 IU/L 61  71   AST 0 - 40 IU/L 18  19   ALT 0 - 32 IU/L 15  17     Lab Results  Component Value Date   WBC 6.2 03/14/2019   HGB 12.3 03/14/2019   HCT 37.9 03/14/2019   MCV 90.0 03/14/2019   PLT 206 03/14/2019   NEUTROABS 4.1 03/14/2019    ASSESSMENT & PLAN:  No problem-specific Assessment & Plan notes found for this encounter.    No orders of the defined types were placed in this encounter.  The patient has a good understanding of the overall plan. she agrees with it. she will call with any problems that may develop before the next visit here. Total  time spent: 30 mins including face to face time and time spent for planning, charting and co-ordination of care   Suzzette Righter, [place] 05/10/22    I Gardiner Coins am scribing for Dr. Lindi Adie  ***

## 2022-05-11 ENCOUNTER — Inpatient Hospital Stay: Payer: Medicare Other | Attending: Hematology and Oncology | Admitting: Hematology and Oncology

## 2022-05-11 ENCOUNTER — Other Ambulatory Visit: Payer: Self-pay

## 2022-05-11 DIAGNOSIS — M8580 Other specified disorders of bone density and structure, unspecified site: Secondary | ICD-10-CM | POA: Insufficient documentation

## 2022-05-11 DIAGNOSIS — Z79810 Long term (current) use of selective estrogen receptor modulators (SERMs): Secondary | ICD-10-CM | POA: Diagnosis not present

## 2022-05-11 DIAGNOSIS — Z923 Personal history of irradiation: Secondary | ICD-10-CM | POA: Insufficient documentation

## 2022-05-11 DIAGNOSIS — D0511 Intraductal carcinoma in situ of right breast: Secondary | ICD-10-CM | POA: Diagnosis not present

## 2022-05-11 DIAGNOSIS — Z170 Estrogen receptor positive status [ER+]: Secondary | ICD-10-CM | POA: Insufficient documentation

## 2022-05-11 NOTE — Assessment & Plan Note (Addendum)
03/18/2019: Screening mammogram detected indeterminate groups of calcifications 1.5 cm and 0.8 cm biopsy initially showed LCIS.  Right lumpectomy Dalbert Batman): DCIS, intermediate grade, focally involving a small intraductal papilloma, clear margins, no invasive carcinoma, ER+ 100%, PR+ 50 Adjuvant radiation 05/01/2019-05/23/2019  Treatment plan: Tamoxifen 20 mg daily x5 years started 06/10/2019 Tamoxifen toxicities: Denies any adverse effects to tamoxifen therapy  Breast cancer surveillance: 1. Mammogram 02/27/2022: Benign breast density category B 2. breast exam9/21/2023: Rash underneath the right breast 3. Bone density 02/02/2020: T score -1.4: Mild osteopenia, another bone density is being scheduled  Return to clinic in 1 year for follow-up

## 2022-05-12 ENCOUNTER — Telehealth: Payer: Self-pay | Admitting: Hematology and Oncology

## 2022-05-12 NOTE — Telephone Encounter (Signed)
Scheduled appointment per 9/21 los. Left voicemail.

## 2022-05-16 DIAGNOSIS — M1711 Unilateral primary osteoarthritis, right knee: Secondary | ICD-10-CM | POA: Diagnosis not present

## 2022-05-16 DIAGNOSIS — M25561 Pain in right knee: Secondary | ICD-10-CM | POA: Diagnosis not present

## 2022-05-22 DIAGNOSIS — M25512 Pain in left shoulder: Secondary | ICD-10-CM | POA: Diagnosis not present

## 2022-05-31 ENCOUNTER — Emergency Department (HOSPITAL_BASED_OUTPATIENT_CLINIC_OR_DEPARTMENT_OTHER): Payer: Medicare Other | Admitting: Radiology

## 2022-05-31 ENCOUNTER — Other Ambulatory Visit: Payer: Self-pay

## 2022-05-31 ENCOUNTER — Emergency Department (HOSPITAL_BASED_OUTPATIENT_CLINIC_OR_DEPARTMENT_OTHER)
Admission: EM | Admit: 2022-05-31 | Discharge: 2022-06-01 | Disposition: A | Discharge status: Home / Self Care | Payer: Medicare Other | Attending: Emergency Medicine | Admitting: Emergency Medicine

## 2022-05-31 ENCOUNTER — Encounter (HOSPITAL_BASED_OUTPATIENT_CLINIC_OR_DEPARTMENT_OTHER): Payer: Self-pay | Admitting: Emergency Medicine

## 2022-05-31 DIAGNOSIS — Z7984 Long term (current) use of oral hypoglycemic drugs: Secondary | ICD-10-CM | POA: Diagnosis not present

## 2022-05-31 DIAGNOSIS — R112 Nausea with vomiting, unspecified: Secondary | ICD-10-CM | POA: Diagnosis not present

## 2022-05-31 DIAGNOSIS — Z7982 Long term (current) use of aspirin: Secondary | ICD-10-CM | POA: Insufficient documentation

## 2022-05-31 DIAGNOSIS — E119 Type 2 diabetes mellitus without complications: Secondary | ICD-10-CM | POA: Diagnosis not present

## 2022-05-31 DIAGNOSIS — R350 Frequency of micturition: Secondary | ICD-10-CM | POA: Diagnosis present

## 2022-05-31 DIAGNOSIS — N3090 Cystitis, unspecified without hematuria: Secondary | ICD-10-CM | POA: Insufficient documentation

## 2022-05-31 DIAGNOSIS — R509 Fever, unspecified: Secondary | ICD-10-CM | POA: Diagnosis not present

## 2022-05-31 DIAGNOSIS — D72829 Elevated white blood cell count, unspecified: Secondary | ICD-10-CM | POA: Insufficient documentation

## 2022-05-31 DIAGNOSIS — J9811 Atelectasis: Secondary | ICD-10-CM | POA: Insufficient documentation

## 2022-05-31 LAB — CBC WITH DIFFERENTIAL/PLATELET
Abs Immature Granulocytes: 0.06 10*3/uL (ref 0.00–0.07)
Basophils Absolute: 0 10*3/uL (ref 0.0–0.1)
Basophils Relative: 0 %
Eosinophils Absolute: 0.1 10*3/uL (ref 0.0–0.5)
Eosinophils Relative: 0 %
HCT: 37.4 % (ref 36.0–46.0)
Hemoglobin: 12.2 g/dL (ref 12.0–15.0)
Immature Granulocytes: 0 %
Lymphocytes Relative: 5 %
Lymphs Abs: 0.7 10*3/uL (ref 0.7–4.0)
MCH: 28.8 pg (ref 26.0–34.0)
MCHC: 32.6 g/dL (ref 30.0–36.0)
MCV: 88.4 fL (ref 80.0–100.0)
Monocytes Absolute: 0.3 10*3/uL (ref 0.1–1.0)
Monocytes Relative: 3 %
Neutro Abs: 12.3 10*3/uL — ABNORMAL HIGH (ref 1.7–7.7)
Neutrophils Relative %: 92 %
Platelets: 170 10*3/uL (ref 150–400)
RBC: 4.23 MIL/uL (ref 3.87–5.11)
RDW: 12.9 % (ref 11.5–15.5)
WBC: 13.4 10*3/uL — ABNORMAL HIGH (ref 4.0–10.5)
nRBC: 0 % (ref 0.0–0.2)

## 2022-05-31 LAB — PROTIME-INR
INR: 1 (ref 0.8–1.2)
Prothrombin Time: 13.4 seconds (ref 11.4–15.2)

## 2022-05-31 LAB — COMPREHENSIVE METABOLIC PANEL
ALT: 15 U/L (ref 0–44)
AST: 15 U/L (ref 15–41)
Albumin: 4.4 g/dL (ref 3.5–5.0)
Alkaline Phosphatase: 60 U/L (ref 38–126)
Anion gap: 11 (ref 5–15)
BUN: 22 mg/dL (ref 8–23)
CO2: 27 mmol/L (ref 22–32)
Calcium: 9.7 mg/dL (ref 8.9–10.3)
Chloride: 97 mmol/L — ABNORMAL LOW (ref 98–111)
Creatinine, Ser: 1.27 mg/dL — ABNORMAL HIGH (ref 0.44–1.00)
GFR, Estimated: 45 mL/min — ABNORMAL LOW (ref >60)
Glucose, Bld: 172 mg/dL — ABNORMAL HIGH (ref 70–99)
Potassium: 3.3 mmol/L — ABNORMAL LOW (ref 3.5–5.1)
Sodium: 135 mmol/L (ref 135–145)
Total Bilirubin: 0.5 mg/dL (ref 0.3–1.2)
Total Protein: 8.1 g/dL (ref 6.5–8.1)

## 2022-05-31 LAB — URINALYSIS, ROUTINE W REFLEX MICROSCOPIC
Glucose, UA: NEGATIVE mg/dL
Ketones, ur: NEGATIVE mg/dL
Nitrite: POSITIVE — AB
Protein, ur: 100 mg/dL — AB
RBC / HPF: >50 RBC/hpf — ABNORMAL HIGH (ref 0–5)
Specific Gravity, Urine: 1.02 (ref 1.005–1.030)
WBC, UA: >50 WBC/hpf — ABNORMAL HIGH (ref 0–5)
pH: 5.5 (ref 5.0–8.0)

## 2022-05-31 LAB — LACTIC ACID, PLASMA
Lactic Acid, Venous: 1.5 mmol/L (ref 0.5–1.9)
Lactic Acid, Venous: 0.9 mmol/L (ref 0.5–1.9)

## 2022-05-31 MED ORDER — ACETAMINOPHEN 325 MG PO TABS
650.0000 mg | ORAL_TABLET | Freq: Once | ORAL | Status: AC
  Administered 2022-05-31: 650 mg via ORAL
  Filled 2022-05-31: qty 2

## 2022-05-31 MED ORDER — LACTATED RINGERS IV SOLN
INTRAVENOUS | Status: DC
Start: 2022-05-31 — End: 2022-06-01

## 2022-05-31 MED ORDER — SODIUM CHLORIDE 0.9 % IV BOLUS (SEPSIS)
1000.0000 mL | Freq: Once | INTRAVENOUS | Status: AC
Start: 2022-05-31 — End: 2022-05-31
  Administered 2022-05-31: 1000 mL via INTRAVENOUS

## 2022-05-31 MED ORDER — SODIUM CHLORIDE 0.9 % IV SOLN
2.0000 g | INTRAVENOUS | Status: DC
  Administered 2022-05-31: 2 g via INTRAVENOUS

## 2022-05-31 NOTE — ED Triage Notes (Signed)
Patient reports urinary frequency and fever since Monday. Patient also endorses burning with urination.

## 2022-05-31 NOTE — Sepsis Progress Note (Signed)
Elink monitoring for the code sepsis protocol.  

## 2022-05-31 NOTE — ED Notes (Signed)
Patient to xray.

## 2022-05-31 NOTE — ED Notes (Signed)
Patient's husband also reports periods of confusion and forgetfulness.

## 2022-06-01 LAB — BLOOD CULTURE ID PANEL (REFLEXED) - BCID2

## 2022-06-01 MED ORDER — CEPHALEXIN 500 MG PO CAPS: 500.0000 mg | ORAL_CAPSULE | Freq: Three times a day (TID) | ORAL | 0 refills | Status: DC

## 2022-06-01 MED ORDER — ONDANSETRON 8 MG PO TBDP: 8.0000 mg | ORAL_TABLET | Freq: Three times a day (TID) | ORAL | 0 refills | Status: DC | PRN

## 2022-06-01 NOTE — ED Provider Notes (Signed)
[place] EMERGENCY DEPT Provider Note   CSN: [id number] Arrival date & time: 05/31/22  2019     History  Chief Complaint  Patient presents with   Urinary Frequency   Fever    [name] is a 71 y.o. female.  HPI    71 year old female comes in with chief complaint of fevers, chills, nausea and vomiting.  She indicates that she started having some urinary frequency 2 days ago.  She is also having burning with urination.  She has been taking Azo, but tonight while having dinner she started having chills and nausea with an episode of emesis.  Patient has no history of UTI.  Past medical history positive for diabetes.  She denies any flank pain.  Home Medications Prior to Admission medications   Medication Sig Start Date End Date Taking? Authorizing Provider  cephALEXin (KEFLEX) 500 MG capsule Take 1 capsule (500 mg total) by mouth 3 (three) times daily. 06/01/22  Yes Adrain Nesbit, MD  ondansetron (ZOFRAN-ODT) 8 MG disintegrating tablet Take 1 tablet (8 mg total) by mouth every 8 (eight) hours as needed for nausea. 06/01/22  Yes Varney Biles, MD  aspirin 81 MG tablet Take 81 mg by mouth daily.    [provider]  calcium carbonate (CALTRATE 600) 1500 (600 Ca) MG TABS tablet Take 1 tablet (1,500 mg total) by mouth daily with breakfast. 04/19/20   Nicholas Lose, MD  Cholecalciferol (VITAMIN D) 50 MCG (2000 UT) CAPS Take 1 capsule (2,000 Units total) by mouth daily. 04/19/20   Nicholas Lose, MD  ibuprofen (ADVIL) 800 MG tablet Take 800 mg by mouth daily. 03/21/22   [provider]  Lancets (ONETOUCH DELICA PLUS [id number]) [place] Apply topically as directed. 10/06/20   [provider]  metFORMIN (GLUCOPHAGE) 500 MG tablet Take 1 tablet (500 mg total) by mouth daily with breakfast. 05/23/19   Nicholas Lose, MD  [facility] ULTRA test strip USE AS DIRECTED DAILY FOR TESTING 04/28/22   [provider]  pravastatin (PRAVACHOL) 40 MG  tablet Take 40 mg by mouth daily.    [provider]  spironolactone-hydrochlorothiazide (ALDACTAZIDE) 25-25 MG per tablet Take 1 tablet by mouth daily.     [provider]  tamoxifen (NOLVADEX) 20 MG tablet TAKE 1 TABLET BY MOUTH  DAILY 02/15/22   Nicholas Lose, MD      Allergies    Patient has no known allergies.    Review of Systems   Review of Systems  All other systems reviewed and are negative.   Physical Exam Updated Vital Signs BP 130/62   Pulse 86   Temp 99.2 F (37.3 C) (Oral)   Resp (!) 21   Ht '5\' 4"'$  (1.626 m)   Wt 103 kg   SpO2 94%   BMI 38.96 kg/m  Physical Exam Vitals and nursing note reviewed.  Constitutional:      Appearance: She is well-developed.  HENT:     Head: Atraumatic.  Cardiovascular:     Rate and Rhythm: Normal rate.  Pulmonary:     Effort: Pulmonary effort is normal.  Musculoskeletal:     Cervical back: Normal range of motion and neck supple.  Skin:    General: Skin is warm and dry.  Neurological:     Mental Status: She is alert and oriented to person, place, and time.     ED Results / Procedures / Treatments   Labs (all labs ordered are listed, but only abnormal results are displayed) Labs Reviewed  COMPREHENSIVE METABOLIC PANEL - Abnormal; Notable for the following components:      Result Value   Potassium 3.3 (*)    Chloride 97 (*)    Glucose, Bld 172 (*)    Creatinine, Ser 1.27 (*)    GFR, Estimated 45 (*)    All other components within normal limits  CBC WITH DIFFERENTIAL/PLATELET - Abnormal; Notable for the following components:   WBC 13.4 (*)    Neutro Abs 12.3 (*)    All other components within normal limits  URINALYSIS, ROUTINE W REFLEX MICROSCOPIC - Abnormal; Notable for the following components:   APPearance CLOUDY (*)    Hgb urine dipstick LARGE (*)    Bilirubin Urine SMALL (*)    Protein, ur 100 (*)    Nitrite POSITIVE (*)    Leukocytes,Ua LARGE (*)    RBC / HPF >50 (*)    WBC, UA >50 (*)     Bacteria, UA RARE (*)    Non Squamous Epithelial 11-20 (*)    All other components within normal limits  CULTURE, BLOOD (ROUTINE X 2)  CULTURE, BLOOD (ROUTINE X 2)  LACTIC ACID, PLASMA  LACTIC ACID, PLASMA  PROTIME-INR    EKG None  Radiology DG Chest 2 View  Result Date: 05/31/2022 CLINICAL DATA:  Fever, urinary frequency EXAM: CHEST - 2 VIEW COMPARISON:  None Available. FINDINGS: Mild left basilar atelectasis. Right lung is clear. No pleural effusion or pneumothorax. The heart is normal in size. Visualized osseous structures are within normal limits. IMPRESSION: Mild left basilar atelectasis. Electronically Signed   By: Julian Hy M.D.   On: 05/31/2022 21:06    Procedures Procedures    Medications Ordered in ED Medications  lactated ringers infusion (0 mLs Intravenous Stopped 06/01/22 0026)  cefTRIAXone (ROCEPHIN) 2 g in sodium chloride 0.9 % 100 mL IVPB (0 g Intravenous Stopped 05/31/22 2256)  acetaminophen (TYLENOL) tablet 650 mg (650 mg Oral Given 05/31/22 2034)  sodium chloride 0.9 % bolus 1,000 mL (0 mLs Intravenous Stopped 05/31/22 2309)    ED Course/ Medical Decision Making/ A&P                           Medical Decision Making Amount and/or Complexity of Data Reviewed Labs: ordered. Radiology: ordered.  Risk OTC drugs. Prescription drug management.   This patient presents to the ED with chief complaint(s) of fevers, chills, nausea, vomiting along with urinary frequency and dysuria with pertinent past medical history of diabetes.The complaint involves an extensive differential diagnosis and also carries with it a high risk of complications and morbidity.    The differential diagnosis includes : Cystitis, pyelonephritis, bacteremia. Patient meets SIRS criteria at arrival.  Sepsis labs were initiated in triage. On my assessment, patient is nontoxic. We will reassess her after initial work-up has been completed. High suspicion for cystitis and not  pyelonephritis, we will start patient on antibiotics.  Additional history obtained: Additional history obtained from spouse -who states that there has been some increased forgetfulness events, but patient AOx3 and had gone to [place] for her own birthday party with no issues just this evening Records reviewed [place].  Independent labs interpretation:  The following labs were independently interpreted: Lactic acid is reassuring.  White count is slightly elevated.  No organ dysfunction noted on our lab work-up. Urine analysis is nitrite positive.  Treatment and Reassessment: Patient reassessed at 12:15 AM.  She is feeling a lot better.  Fevers have subsided.  She continues to maintain no flank pain.  No nausea or vomiting here.  Received IV ceftriaxone.  Stable for discharge.  Strict ER return precautions have been discussed, and patient is agreeing with the plan and is comfortable with the workup done and the recommendations from the ER.   Consideration for admission or further workup: Admission considered for sepsis, however no endorgan dysfunction and patient is better.   Final Clinical Impression(s) / ED Diagnoses Final diagnoses:  Cystitis    Rx / DC Orders ED Discharge Orders          Ordered    ondansetron (ZOFRAN-ODT) 8 MG disintegrating tablet  Every 8 hours PRN        06/01/22 0024    cephALEXin (KEFLEX) 500 MG capsule  3 times daily        06/01/22 0024              Varney Biles, MD 06/01/22 [phone]

## 2022-06-01 NOTE — Discharge Instructions (Signed)
You are seen in the ER for fevers, chills and some urinary discomfort.  Our labs confirm that you have bladder infection. Fortunately, you will not need admission to manage the infection.  Please take the antibiotics that are prescribed. Please return to the ER if your symptoms worsen; you have increased pain, fevers, chills, inability to keep any medications down, confusion. Otherwise see the outpatient doctor as requested.

## 2022-06-01 NOTE — ED Notes (Signed)
Lab called and shared positive blood culture results which were shared with currently available provider (Dr.Belfi). Results for blood cultures sheared with pt via phone by EDP and recommendations given.

## 2022-06-01 NOTE — ED Notes (Signed)
Reviewed AVS/discharge instruction with patient. Time allotted for and all questions answered. Patient is agreeable for d/c and escorted to ed exit by staff.  

## 2022-06-03 LAB — CULTURE, BLOOD (ROUTINE X 2)

## 2022-06-04 ENCOUNTER — Telehealth (HOSPITAL_BASED_OUTPATIENT_CLINIC_OR_DEPARTMENT_OTHER): Payer: Self-pay | Admitting: *Deleted

## 2022-06-04 NOTE — Telephone Encounter (Signed)
Post ED Visit - Positive Culture Follow-up  Culture report reviewed by antimicrobial stewardship pharmacist: [place] Team '[]'$  Elenor Quinones, Pharm.D. '[]'$  Heide Guile, Pharm.D., BCPS AQ-ID '[]'$  Parks Neptune, Pharm.D., BCPS '[]'$  Alycia Rossetti, Pharm.D., BCPS '[]'$  Verdon, Pharm.D., BCPS, AAHIVP '[]'$  Legrand Como, Pharm.D., BCPS, AAHIVP '[]'$  Salome Arnt, PharmD, BCPS '[]'$  Johnnette Gourd, PharmD, BCPS '[]'$  Hughes Better, PharmD, BCPS '[]'$  Leeroy Cha, PharmD '[]'$  Laqueta Linden, PharmD, BCPS '[x]'$  Bertis Ruddy, PharmD  [place] Team '[]'$  Leodis Sias, PharmD '[]'$  Lindell Spar, PharmD '[]'$  Royetta Asal, PharmD '[]'$  Graylin Shiver, Rph '[]'$  Rema Fendt) Glennon Mac, PharmD '[]'$  Arlyn Dunning, PharmD '[]'$  Netta Cedars, PharmD '[]'$  Dia Sitter, PharmD '[]'$  Leone Haven, PharmD '[]'$  Gretta Arab, PharmD '[]'$  Theodis Shove, PharmD '[]'$  Peggyann Juba, PharmD '[]'$  Reuel Boom, PharmD   Positive blood culture Results called in. Pt contacted 10/12 per MD with recommendations  Rosie Fate 06/04/2022, 12:36 PM

## 2022-06-05 DIAGNOSIS — N390 Urinary tract infection, site not specified: Secondary | ICD-10-CM | POA: Diagnosis not present

## 2022-06-05 DIAGNOSIS — E1169 Type 2 diabetes mellitus with other specified complication: Secondary | ICD-10-CM | POA: Diagnosis not present

## 2022-06-06 LAB — CULTURE, BLOOD (ROUTINE X 2)
Culture: NO GROWTH
Special Requests: ADEQUATE

## 2022-06-12 DIAGNOSIS — E1169 Type 2 diabetes mellitus with other specified complication: Secondary | ICD-10-CM | POA: Diagnosis not present

## 2022-06-12 DIAGNOSIS — N390 Urinary tract infection, site not specified: Secondary | ICD-10-CM | POA: Diagnosis not present

## 2022-06-20 DIAGNOSIS — E7849 Other hyperlipidemia: Secondary | ICD-10-CM | POA: Diagnosis not present

## 2022-06-20 DIAGNOSIS — E1165 Type 2 diabetes mellitus with hyperglycemia: Secondary | ICD-10-CM | POA: Diagnosis not present

## 2022-06-20 DIAGNOSIS — I10 Essential (primary) hypertension: Secondary | ICD-10-CM | POA: Diagnosis not present

## 2022-07-17 ENCOUNTER — Telehealth: Payer: Self-pay

## 2022-07-17 ENCOUNTER — Ambulatory Visit
Admission: RE | Admit: 2022-07-17 | Discharge: 2022-07-17 | Disposition: A | Discharge status: Home / Self Care | Payer: Medicare Other | Source: Ambulatory Visit | Attending: Adult Health | Admitting: Adult Health

## 2022-07-17 DIAGNOSIS — M8580 Other specified disorders of bone density and structure, unspecified site: Secondary | ICD-10-CM

## 2022-07-17 DIAGNOSIS — Z780 Asymptomatic menopausal state: Secondary | ICD-10-CM | POA: Diagnosis not present

## 2022-07-17 DIAGNOSIS — D0511 Intraductal carcinoma in situ of right breast: Secondary | ICD-10-CM

## 2022-07-17 NOTE — Telephone Encounter (Signed)
Called pt per NP's request. Results were discussed with the pt. She verbalized understanding.

## 2022-07-17 NOTE — Telephone Encounter (Signed)
-----   Message from Gardenia Phlegm, NP sent at 07/17/2022 10:18 AM EST ----- Bone density is normal, please notify patient ----- Message ----- From: Interface, Rad Results In Sent: 07/17/2022   9:33 AM EST To: Gardenia Phlegm, NP

## 2022-10-16 DIAGNOSIS — M1711 Unilateral primary osteoarthritis, right knee: Secondary | ICD-10-CM | POA: Diagnosis not present

## 2022-10-16 DIAGNOSIS — I872 Venous insufficiency (chronic) (peripheral): Secondary | ICD-10-CM | POA: Diagnosis not present

## 2022-10-16 DIAGNOSIS — E1169 Type 2 diabetes mellitus with other specified complication: Secondary | ICD-10-CM | POA: Diagnosis not present

## 2022-10-16 DIAGNOSIS — I10 Essential (primary) hypertension: Secondary | ICD-10-CM | POA: Diagnosis not present

## 2022-10-16 DIAGNOSIS — E7849 Other hyperlipidemia: Secondary | ICD-10-CM | POA: Diagnosis not present

## 2022-10-16 DIAGNOSIS — R202 Paresthesia of skin: Secondary | ICD-10-CM | POA: Diagnosis not present

## 2022-10-19 ENCOUNTER — Encounter: Payer: Self-pay | Admitting: Radiology

## 2022-10-20 DIAGNOSIS — I10 Essential (primary) hypertension: Secondary | ICD-10-CM | POA: Diagnosis not present

## 2022-10-20 DIAGNOSIS — M1711 Unilateral primary osteoarthritis, right knee: Secondary | ICD-10-CM | POA: Diagnosis not present

## 2022-10-20 DIAGNOSIS — E1169 Type 2 diabetes mellitus with other specified complication: Secondary | ICD-10-CM | POA: Diagnosis not present

## 2022-10-20 DIAGNOSIS — I872 Venous insufficiency (chronic) (peripheral): Secondary | ICD-10-CM | POA: Diagnosis not present

## 2022-10-30 ENCOUNTER — Telehealth: Payer: Self-pay | Admitting: Adult Health

## 2022-10-30 NOTE — Telephone Encounter (Signed)
Rescheduled appointment per provider template. Patient is aware of the changes made to her upcoming appointments. 

## 2022-11-09 ENCOUNTER — Other Ambulatory Visit: Payer: Self-pay

## 2022-11-09 ENCOUNTER — Inpatient Hospital Stay: Payer: Medicare Other | Attending: Adult Health | Admitting: Adult Health

## 2022-11-09 ENCOUNTER — Ambulatory Visit: Payer: Medicare Other | Admitting: Adult Health

## 2022-11-09 ENCOUNTER — Encounter: Payer: Self-pay | Admitting: Adult Health

## 2022-11-09 VITALS — BP 129/62 | HR 79 | Temp 97.7°F | Resp 18 | Ht 64.0 in | Wt 227.6 lb

## 2022-11-09 DIAGNOSIS — Z79810 Long term (current) use of selective estrogen receptor modulators (SERMs): Secondary | ICD-10-CM | POA: Diagnosis not present

## 2022-11-09 DIAGNOSIS — D0511 Intraductal carcinoma in situ of right breast: Secondary | ICD-10-CM

## 2022-11-09 DIAGNOSIS — Z923 Personal history of irradiation: Secondary | ICD-10-CM | POA: Insufficient documentation

## 2022-11-09 DIAGNOSIS — N63 Unspecified lump in unspecified breast: Secondary | ICD-10-CM

## 2022-11-09 NOTE — Assessment & Plan Note (Signed)
03/18/2019: Screening mammogram detected indeterminate groups of calcifications 1.5 cm and 0.8 cm biopsy initially showed LCIS.   Right lumpectomy [name]): DCIS, intermediate grade, focally involving a small intraductal papilloma, clear margins, no invasive carcinoma, ER+ 100%, PR+ 50 Adjuvant radiation 05/01/2019-05/23/2019   Treatment plan: Tamoxifen 20 mg daily x5 years Tamoxifen toxicities: None   Breast cancer surveillance: 1.  Mammogram 02/2022: Benign breast density category B--repeat in July 2024 2. breast exam 11/08/2021: Left breast nodule at 3:00 diagnostic mammogram and ultrasound ordered 3.  Bone density 06/2022-normal  I counseled her on healthy diet and exercise she will follow-up with Dr. Lindi Adie in 6 months.

## 2022-11-09 NOTE — Progress Notes (Signed)
[facility] Follow up:    Sara Beard, MD [address] [place] [address]   DIAGNOSIS:  Cancer Staging  Ductal carcinoma in situ (DCIS) of right breast Staging form: Breast, AJCC 8th Edition - Clinical stage from 03/18/2019: Stage 0 (cTis (DCIS), cN0, cM0, ER+, PR+) - Signed by Gardenia Phlegm, NP on 08/13/2019 Stage prefix: Initial diagnosis - Pathologic stage from 03/18/2019: Stage 0 (pTis (DCIS), pN0, cM0, ER+, PR+) - Signed by Gardenia Phlegm, NP on 08/13/2019   SUMMARY OF ONCOLOGIC HISTORY: Oncology History  Ductal carcinoma in situ (DCIS) of right breast  02/19/2019 Initial Diagnosis   Routine screening mammogram detected two indeterminate groups of calcifications in the upper right breast measuring 13mm and 20mm. Biopsy [id number]) showed LCIS with calcifications.    03/18/2019 Surgery   Right lumpectomy Dalbert Batman) [phone]): DCIS, intermediate grade, focally involving a small intraductal papilloma, clear margins, no invasive carcinoma, ER+ 100%, PR+ 50%.    03/18/2019 Cancer Staging   Staging form: Breast, AJCC 8th Edition - Clinical stage from 03/18/2019: Stage 0 (cTis (DCIS), cN0, cM0, ER+, PR+) - Signed by Gardenia Phlegm, NP on 08/13/2019   03/18/2019 Cancer Staging   Staging form: Breast, AJCC 8th Edition - Pathologic stage from 03/18/2019: Stage 0 (pTis (DCIS), pN0, cM0, ER+, PR+) - Signed by Gardenia Phlegm, NP on 08/13/2019   04/30/2019 - 05/27/2019 Radiation Therapy   Adjuvant radiation therapy The whole right breast was treated to 40.05 Gy in 15 fractions. This was followed by a boost to the seroma of 10 Gy in 5 fractions, for a total dose of 50.04 Gy in 20 fractions.   05/2019 -  Anti-estrogen oral therapy   Tamoxifen 20 mg     CURRENT THERAPY: Tamoxifen  INTERVAL HISTORY: [name] 72 y.o. female returns for follow-up of her history of noninvasive breast cancer.  She continues on  tamoxifen daily with good tolerance.  Her most recent mammogram occurred on February 27, 2022 demonstrating no mammographic evidence of malignancy and breast density category B.  Underwent bone density testing in November 2023 that was normal.  She tells me that she stays active by caring for 3 other people and is seeing her primary care provider regularly.  She denies any significant side effects or concerns today.  Patient Active Problem List   Diagnosis Date Noted   Ductal carcinoma in situ (DCIS) of right breast 03/18/2019   Lymphedema 03/28/2013   HYPERLIPIDEMIA-MIXED 02/08/2009   HYPERTENSION, MALIGNANT, UNCONTROLLED 02/08/2009   VENOUS INSUFFICIENCY, CHRONIC 02/08/2009   EDEMA 02/08/2009   CHEST PAIN-UNSPECIFIED 02/08/2009    has No Known Allergies.  MEDICAL HISTORY: Past Medical History:  Diagnosis Date   Diabetes mellitus without complication ([place])    Hyperlipidemia    Hypertension    Lobular carcinoma in situ (LCIS) of right breast 03/18/2019   Obesity    Personal history of radiation therapy    Stasis dermatitis    Venous insufficiency     SURGICAL HISTORY: Past Surgical History:  Procedure Laterality Date   ABDOMINAL HYSTERECTOMY     BREAST LUMPECTOMY Right 03/18/2019   BREAST LUMPECTOMY WITH RADIOACTIVE SEED LOCALIZATION Right 03/18/2019   Procedure: RIGHT BREAST LUMPECTOMY X 2  WITH RIGHT RADIOACTIVE SEED LOCALIZATION X 2;  Surgeon: Fanny Skates, MD;  Location: [facility];  Service: General;  Laterality: Right;   CHOLECYSTECTOMY     COLONOSCOPY N/A 07/02/2013   Procedure: COLONOSCOPY;  Surgeon: Marga Melnick  Fields, MD;  [facility];  Service: Endoscopy;  Laterality: N/A;  9:15 AM    SOCIAL HISTORY: Social History   Socioeconomic History   Marital status: Married    Spouse name: Not on file   Number of children: Not on file   Years of education: Not on file   Highest education level: Not on file  Occupational History   Not on file   Tobacco Use   Smoking status: Never   Smokeless tobacco: Never  Substance and Sexual Activity   Alcohol use: No   Drug use: No   Sexual activity: Not on file  Other Topics Concern   Not on file  Social History Narrative   Not on file   Social Determinants of Health   Financial Resource Strain: Not on file  Food Insecurity: Not on file  Transportation Needs: Not on file  Physical Activity: Not on file  Stress: Not on file  Social Connections: Not on file  Intimate Partner Violence: Not on file    FAMILY HISTORY: Family History  Problem Relation Age of Onset   Diabetes Mother    Heart disease Mother    Hypertension Mother    Cancer Father    Colon cancer Neg Hx     Review of Systems  Constitutional:  Negative for appetite change, chills, fatigue, fever and unexpected weight change.  HENT:   Negative for hearing loss, lump/mass and trouble swallowing.   Eyes:  Negative for eye problems and icterus.  Respiratory:  Negative for chest tightness, cough and shortness of breath.   Cardiovascular:  Negative for chest pain, leg swelling and palpitations.  Gastrointestinal:  Negative for abdominal distention, abdominal pain, constipation, diarrhea, nausea and vomiting.  Endocrine: Negative for hot flashes.  Genitourinary:  Negative for difficulty urinating.   Musculoskeletal:  Negative for arthralgias.  Skin:  Negative for itching and rash.  Neurological:  Negative for dizziness, extremity weakness, headaches and numbness.  Hematological:  Negative for adenopathy. Does not bruise/bleed easily.  Psychiatric/Behavioral:  Negative for depression. The patient is not nervous/anxious.       PHYSICAL EXAMINATION  ECOG PERFORMANCE STATUS: 1 - Symptomatic but completely ambulatory  Vitals:   11/09/22 1116  BP: 129/62  Pulse: 79  Resp: 18  Temp: 97.7 F (36.5 C)  SpO2: 97%    Physical Exam Constitutional:      General: She is not in acute distress.    Appearance: Normal  appearance. She is not toxic-appearing.  HENT:     Head: Normocephalic and atraumatic.  Eyes:     General: No scleral icterus. Cardiovascular:     Rate and Rhythm: Normal rate and regular rhythm.     Pulses: Normal pulses.     Heart sounds: Normal heart sounds.  Pulmonary:     Effort: Pulmonary effort is normal.     Breath sounds: Normal breath sounds.  Chest:     Comments: Right breast status postlumpectomy and radiation no sign of local recurrence left breast with a small soft nodule at 3:00 2 to 3 cm from the nipple.  No axillary adenopathy bilaterally. Abdominal:     General: Abdomen is flat. Bowel sounds are normal. There is no distension.     Palpations: Abdomen is soft.     Tenderness: There is no abdominal tenderness.  Musculoskeletal:        General: No swelling.     Cervical back: Neck supple.  Lymphadenopathy:     Cervical: No cervical  adenopathy.  Skin:    General: Skin is warm and dry.     Findings: No rash.  Neurological:     General: No focal deficit present.     Mental Status: She is alert.  Psychiatric:        Mood and Affect: Mood normal.        Behavior: Behavior normal.     LABORATORY DATA:  None for this visit  ASSESSMENT and THERAPY PLAN:   Ductal carcinoma in situ (DCIS) of right breast 03/18/2019: Screening mammogram detected indeterminate groups of calcifications 1.5 cm and 0.8 cm biopsy initially showed LCIS.   Right lumpectomy Dalbert Batman): DCIS, intermediate grade, focally involving a small intraductal papilloma, clear margins, no invasive carcinoma, ER+ 100%, PR+ 50 Adjuvant radiation 05/01/2019-05/23/2019   Treatment plan: Tamoxifen 20 mg daily x5 years Tamoxifen toxicities: None   Breast cancer surveillance: 1.  Mammogram 02/2022: Benign breast density category B--repeat in July 2024 2. breast exam 11/08/2021: Left breast nodule at 3:00 diagnostic mammogram and ultrasound ordered 3.  Bone density 06/2022-normal  I counseled her on healthy  diet and exercise she will follow-up with Dr. Lindi Adie in 6 months.  All questions were answered. The patient knows to call the clinic with any problems, questions or concerns. We can certainly see the patient much sooner if necessary.  Total encounter time:20 minutes*in face-to-face visit time, chart review, lab review, care coordination, order entry, and documentation of the encounter time.   Wilber Bihari, NP 11/09/22 11:46 AM Medical Oncology and Hematology [facility], [place] [address] Tel. [phone]    Fax. [phone]  *Total Encounter Time as defined by the Centers for Medicare and Medicaid Services includes, in addition to the face-to-face time of a patient visit (documented in the note above) non-face-to-face time: obtaining and reviewing outside history, ordering and reviewing medications, tests or procedures, care coordination (communications with other health care professionals or caregivers) and documentation in the medical record.

## 2022-11-10 ENCOUNTER — Other Ambulatory Visit: Payer: Self-pay | Admitting: *Deleted

## 2022-11-10 DIAGNOSIS — N63 Unspecified lump in unspecified breast: Secondary | ICD-10-CM

## 2022-11-13 ENCOUNTER — Ambulatory Visit
Admission: RE | Admit: 2022-11-13 | Discharge: 2022-11-13 | Disposition: A | Discharge status: Home / Self Care | Payer: Medicare Other | Source: Ambulatory Visit | Attending: Adult Health | Admitting: Adult Health

## 2022-11-13 DIAGNOSIS — N63 Unspecified lump in unspecified breast: Secondary | ICD-10-CM

## 2022-11-13 DIAGNOSIS — N6489 Other specified disorders of breast: Secondary | ICD-10-CM | POA: Diagnosis not present

## 2022-11-13 DIAGNOSIS — R928 Other abnormal and inconclusive findings on diagnostic imaging of breast: Secondary | ICD-10-CM | POA: Diagnosis not present

## 2022-11-14 ENCOUNTER — Telehealth: Payer: Self-pay | Admitting: Adult Health

## 2022-11-14 NOTE — Telephone Encounter (Signed)
Scheduled appointment per scheduling message. Patient is aware of the made appointment. 

## 2022-11-25 ENCOUNTER — Other Ambulatory Visit: Payer: Self-pay | Admitting: Hematology and Oncology

## 2022-11-25 DIAGNOSIS — D0511 Intraductal carcinoma in situ of right breast: Secondary | ICD-10-CM

## 2022-11-30 ENCOUNTER — Encounter: Payer: Self-pay | Admitting: Adult Health

## 2022-11-30 ENCOUNTER — Other Ambulatory Visit: Payer: Self-pay

## 2022-11-30 ENCOUNTER — Inpatient Hospital Stay: Payer: Medicare Other | Attending: Adult Health | Admitting: Adult Health

## 2022-11-30 VITALS — BP 138/64 | HR 76 | Temp 97.5°F | Resp 18 | Ht 64.0 in | Wt 226.4 lb

## 2022-11-30 DIAGNOSIS — D0511 Intraductal carcinoma in situ of right breast: Secondary | ICD-10-CM | POA: Insufficient documentation

## 2022-11-30 DIAGNOSIS — Z923 Personal history of irradiation: Secondary | ICD-10-CM | POA: Insufficient documentation

## 2022-11-30 DIAGNOSIS — Z79810 Long term (current) use of selective estrogen receptor modulators (SERMs): Secondary | ICD-10-CM | POA: Diagnosis not present

## 2022-11-30 NOTE — Assessment & Plan Note (Signed)
03/18/2019: Screening mammogram detected indeterminate groups of calcifications 1.5 cm and 0.8 cm biopsy initially showed LCIS.   Right lumpectomy Derrell Lolling): DCIS, intermediate grade, focally involving a small intraductal papilloma, clear margins, no invasive carcinoma, ER+ 100%, PR+ 50 Adjuvant radiation 05/01/2019-05/23/2019   Treatment plan: Tamoxifen 20 mg daily x5 years Tamoxifen toxicities: None   Breast cancer surveillance: 1.  Mammogram 02/2022: Benign breast density category B--repeat in July 2024 2. breast exam 11/08/2021: Left breast nodule at 3:00 diagnostic mammogram and ultrasound ordered--negative for malignancy 3.  Bone density 06/2022-normal  We reviewed her mammogram, ultrasound results and discussed her breast density today.  She will f/u with Dr. Pamelia Hoit in 6 months.

## 2022-11-30 NOTE — Progress Notes (Signed)
[facility] Follow up:    [name], Gerald, MD [address] [place] [id number]   DIAGNOSIS:  Cancer Staging  Ductal carcinoma in situ (DCIS) of right breast Staging form: Breast, AJCC 8th Edition - Clinical stage from 03/18/2019: Stage 0 (cTis (DCIS), cN0, cM0, ER+, PR+) - Signed by Loa Socksausey, Brode Sculley Cornetto, NP on 08/13/2019 Stage prefix: Initial diagnosis - Pathologic stage from 03/18/2019: Stage 0 (pTis (DCIS), pN0, cM0, ER+, PR+) - Signed by Loa Socksausey, Lenford Beddow Cornetto, NP on 08/13/2019   SUMMARY OF ONCOLOGIC HISTORY: Oncology History  Ductal carcinoma in situ (DCIS) of right breast  02/19/2019 Initial Diagnosis   Routine screening mammogram detected two indeterminate groups of calcifications in the upper right breast measuring 15mm and 8mm. Biopsy ([id number]) showed LCIS with calcifications.    03/18/2019 Surgery   Right lumpectomy Derrell Lolling([facility]) [phone]([id number]): DCIS, intermediate grade, focally involving a small intraductal papilloma, clear margins, no invasive carcinoma, ER+ 100%, PR+ 50%.    03/18/2019 Cancer Staging   Staging form: Breast, AJCC 8th Edition - Clinical stage from 03/18/2019: Stage 0 (cTis (DCIS), cN0, cM0, ER+, PR+) - Signed by Loa Socksausey, Tilda Samudio Cornetto, NP on 08/13/2019   03/18/2019 Cancer Staging   Staging form: Breast, AJCC 8th Edition - Pathologic stage from 03/18/2019: Stage 0 (pTis (DCIS), pN0, cM0, ER+, PR+) - Signed by Loa Socksausey, Alize Borrayo Cornetto, NP on 08/13/2019   04/30/2019 - 05/27/2019 Radiation Therapy   Adjuvant radiation therapy The whole right breast was treated to 40.05 Gy in 15 fractions. This was followed by a boost to the seroma of 10 Gy in 5 fractions, for a total dose of 50.04 Gy in 20 fractions.   05/2019 -  Anti-estrogen oral therapy   Tamoxifen 20 mg     CURRENT THERAPY:Tamoxifen  INTERVAL HISTORY: [name] 72 y.o. female returns for f/u after undergoing her mammogram and ultrasound for palpable soft nodule in  her left breast.  The mammogram and ultrasound was negative for malignancy.  She had breast density category B.  She continues on Tamoxifen with good tolerance.   Patient Active Problem List   Diagnosis Date Noted   Ductal carcinoma in situ (DCIS) of right breast 03/18/2019   Lymphedema 03/28/2013   HYPERLIPIDEMIA-MIXED 02/08/2009   HYPERTENSION, MALIGNANT, UNCONTROLLED 02/08/2009   VENOUS INSUFFICIENCY, CHRONIC 02/08/2009   EDEMA 02/08/2009   CHEST PAIN-UNSPECIFIED 02/08/2009    has No Known Allergies.  MEDICAL HISTORY: Past Medical History:  Diagnosis Date   Diabetes mellitus without complication    Hyperlipidemia    Hypertension    Lobular carcinoma in situ (LCIS) of right breast 03/18/2019   Obesity    Personal history of radiation therapy    Stasis dermatitis    Venous insufficiency     SURGICAL HISTORY: Past Surgical History:  Procedure Laterality Date   ABDOMINAL HYSTERECTOMY     BREAST LUMPECTOMY Right 03/18/2019   BREAST LUMPECTOMY WITH RADIOACTIVE SEED LOCALIZATION Right 03/18/2019   Procedure: RIGHT BREAST LUMPECTOMY X 2  WITH RIGHT RADIOACTIVE SEED LOCALIZATION X 2;  Surgeon: Claud KelpIngram, Haywood, MD;  Location: [facility];  Service: General;  Laterality: Right;   CHOLECYSTECTOMY     COLONOSCOPY N/A 07/02/2013   Procedure: COLONOSCOPY;  Surgeon: West BaliSandi L Fields, MD;  [facility];  Service: Endoscopy;  Laterality: N/A;  9:15 AM    SOCIAL HISTORY: Social History   Socioeconomic History   Marital status: Married    Spouse name: Not on file   Number  of children: Not on file   Years of education: Not on file   Highest education level: Not on file  Occupational History   Not on file  Tobacco Use   Smoking status: Never   Smokeless tobacco: Never  Substance and Sexual Activity   Alcohol use: No   Drug use: No   Sexual activity: Not on file  Other Topics Concern   Not on file  Social History Narrative   Not on file   Social  Determinants of Health   Financial Resource Strain: Not on file  Food Insecurity: Not on file  Transportation Needs: Not on file  Physical Activity: Not on file  Stress: Not on file  Social Connections: Not on file  Intimate Partner Violence: Not on file    FAMILY HISTORY: Family History  Problem Relation Age of Onset   Diabetes Mother    Heart disease Mother    Hypertension Mother    Cancer Father    Colon cancer Neg Hx     Review of Systems  Constitutional:  Negative for appetite change, chills, fatigue, fever and unexpected weight change.  HENT:   Negative for hearing loss, lump/mass and trouble swallowing.   Eyes:  Negative for eye problems and icterus.  Respiratory:  Negative for chest tightness, cough and shortness of breath.   Cardiovascular:  Negative for chest pain, leg swelling and palpitations.  Gastrointestinal:  Negative for abdominal distention, abdominal pain, constipation, diarrhea, nausea and vomiting.  Endocrine: Negative for hot flashes.  Genitourinary:  Negative for difficulty urinating.   Musculoskeletal:  Negative for arthralgias.  Skin:  Negative for itching and rash.  Neurological:  Negative for dizziness, extremity weakness, headaches and numbness.  Hematological:  Negative for adenopathy. Does not bruise/bleed easily.  Psychiatric/Behavioral:  Negative for depression. The patient is not nervous/anxious.       PHYSICAL EXAMINATION   Onc Performance Status - 11/30/22 0845       ECOG Perf Status   ECOG Perf Status Fully active, able to carry on all pre-disease performance without restriction      KPS SCALE   KPS % SCORE Normal, no compliants, no evidence of disease             Vitals:   11/30/22 0848  BP: 138/64  Pulse: 76  Resp: 18  Temp: (!) 97.5 F (36.4 C)  SpO2: 97%  Patient appears well, in no apparent distress, mood and behavior normal.     LABORATORY DATA:  None for this visit     ASSESSMENT and THERAPY PLAN:    Ductal carcinoma in situ (DCIS) of right breast 03/18/2019: Screening mammogram detected indeterminate groups of calcifications 1.5 cm and 0.8 cm biopsy initially showed LCIS.   Right lumpectomy Derrell Lolling): DCIS, intermediate grade, focally involving a small intraductal papilloma, clear margins, no invasive carcinoma, ER+ 100%, PR+ 50 Adjuvant radiation 05/01/2019-05/23/2019   Treatment plan: Tamoxifen 20 mg daily x5 years Tamoxifen toxicities: None   Breast cancer surveillance: 1.  Mammogram 02/2022: Benign breast density category B--repeat in July 2024 2. breast exam 11/08/2021: Left breast nodule at 3:00 diagnostic mammogram and ultrasound ordered--negative for malignancy 3.  Bone density 06/2022-normal  We reviewed her mammogram, ultrasound results and discussed her breast density today.  She will f/u with Dr. Pamelia Hoit in 6 months.     All questions were answered. The patient knows to call the clinic with any problems, questions or concerns. We can certainly see the patient much sooner if necessary.  Total encounter time:10 minutes*in face-to-face visit time, chart review, lab review, care coordination, order entry, and documentation of the encounter time.  Lillard Anes, NP 11/30/22 9:19 AM Medical Oncology and Hematology [facility] [address] [place], [place] [address] Tel. [phone]    Fax. [phone]  *Total Encounter Time as defined by the Centers for Medicare and Medicaid Services includes, in addition to the face-to-face time of a patient visit (documented in the note above) non-face-to-face time: obtaining and reviewing outside history, ordering and reviewing medications, tests or procedures, care coordination (communications with other health care professionals or caregivers) and documentation in the medical record.

## 2022-12-29 ENCOUNTER — Other Ambulatory Visit: Payer: Medicare Other

## 2023-01-29 ENCOUNTER — Other Ambulatory Visit: Payer: Self-pay | Admitting: Adult Health

## 2023-01-29 DIAGNOSIS — Z1231 Encounter for screening mammogram for malignant neoplasm of breast: Secondary | ICD-10-CM

## 2023-02-20 DIAGNOSIS — E785 Hyperlipidemia, unspecified: Secondary | ICD-10-CM | POA: Diagnosis not present

## 2023-02-20 DIAGNOSIS — I10 Essential (primary) hypertension: Secondary | ICD-10-CM | POA: Diagnosis not present

## 2023-02-20 DIAGNOSIS — I872 Venous insufficiency (chronic) (peripheral): Secondary | ICD-10-CM | POA: Diagnosis not present

## 2023-02-20 DIAGNOSIS — R1013 Epigastric pain: Secondary | ICD-10-CM | POA: Diagnosis not present

## 2023-02-20 DIAGNOSIS — E1169 Type 2 diabetes mellitus with other specified complication: Secondary | ICD-10-CM | POA: Diagnosis not present

## 2023-03-01 ENCOUNTER — Ambulatory Visit
Admission: RE | Admit: 2023-03-01 | Discharge: 2023-03-01 | Disposition: A | Discharge status: Home / Self Care | Payer: Medicare Other | Source: Ambulatory Visit | Attending: Adult Health | Admitting: Adult Health

## 2023-03-01 DIAGNOSIS — Z1231 Encounter for screening mammogram for malignant neoplasm of breast: Secondary | ICD-10-CM | POA: Diagnosis not present

## 2023-03-20 ENCOUNTER — Other Ambulatory Visit (HOSPITAL_COMMUNITY): Payer: Self-pay | Admitting: Family Medicine

## 2023-03-20 ENCOUNTER — Ambulatory Visit (HOSPITAL_COMMUNITY)
Admission: RE | Admit: 2023-03-20 | Discharge: 2023-03-20 | Disposition: A | Discharge status: Home / Self Care | Payer: Medicare Other | Source: Ambulatory Visit | Attending: Family Medicine | Admitting: Family Medicine

## 2023-03-20 DIAGNOSIS — M130 Polyarthritis, unspecified: Secondary | ICD-10-CM

## 2023-03-20 DIAGNOSIS — R1013 Epigastric pain: Secondary | ICD-10-CM | POA: Diagnosis not present

## 2023-03-20 DIAGNOSIS — E1169 Type 2 diabetes mellitus with other specified complication: Secondary | ICD-10-CM | POA: Diagnosis not present

## 2023-03-20 DIAGNOSIS — M170 Bilateral primary osteoarthritis of knee: Secondary | ICD-10-CM | POA: Diagnosis not present

## 2023-03-20 DIAGNOSIS — M2559 Pain in other specified joint: Secondary | ICD-10-CM | POA: Diagnosis not present

## 2023-03-20 DIAGNOSIS — M1712 Unilateral primary osteoarthritis, left knee: Secondary | ICD-10-CM | POA: Diagnosis not present

## 2023-03-20 DIAGNOSIS — M1711 Unilateral primary osteoarthritis, right knee: Secondary | ICD-10-CM | POA: Diagnosis not present

## 2023-04-13 DIAGNOSIS — K581 Irritable bowel syndrome with constipation: Secondary | ICD-10-CM | POA: Diagnosis not present

## 2023-04-13 DIAGNOSIS — I10 Essential (primary) hypertension: Secondary | ICD-10-CM | POA: Diagnosis not present

## 2023-04-13 DIAGNOSIS — E782 Mixed hyperlipidemia: Secondary | ICD-10-CM | POA: Diagnosis not present

## 2023-04-13 DIAGNOSIS — M545 Low back pain, unspecified: Secondary | ICD-10-CM | POA: Diagnosis not present

## 2023-04-13 DIAGNOSIS — E1165 Type 2 diabetes mellitus with hyperglycemia: Secondary | ICD-10-CM | POA: Diagnosis not present

## 2023-04-24 DIAGNOSIS — M170 Bilateral primary osteoarthritis of knee: Secondary | ICD-10-CM | POA: Diagnosis not present

## 2023-04-24 DIAGNOSIS — M545 Low back pain, unspecified: Secondary | ICD-10-CM | POA: Diagnosis not present

## 2023-04-24 DIAGNOSIS — I10 Essential (primary) hypertension: Secondary | ICD-10-CM | POA: Diagnosis not present

## 2023-04-24 DIAGNOSIS — E1169 Type 2 diabetes mellitus with other specified complication: Secondary | ICD-10-CM | POA: Diagnosis not present

## 2023-05-05 ENCOUNTER — Other Ambulatory Visit: Payer: Self-pay | Admitting: Adult Health

## 2023-05-05 DIAGNOSIS — D0511 Intraductal carcinoma in situ of right breast: Secondary | ICD-10-CM

## 2023-05-14 ENCOUNTER — Inpatient Hospital Stay: Payer: Medicare Other | Attending: Hematology and Oncology | Admitting: Hematology and Oncology

## 2023-05-14 VITALS — BP 151/63 | HR 94 | Temp 98.8°F | Resp 17 | Wt 219.9 lb

## 2023-05-14 DIAGNOSIS — M179 Osteoarthritis of knee, unspecified: Secondary | ICD-10-CM | POA: Diagnosis not present

## 2023-05-14 DIAGNOSIS — Z923 Personal history of irradiation: Secondary | ICD-10-CM | POA: Diagnosis not present

## 2023-05-14 DIAGNOSIS — Z800 Family history of malignant neoplasm of digestive organs: Secondary | ICD-10-CM | POA: Insufficient documentation

## 2023-05-14 DIAGNOSIS — D0511 Intraductal carcinoma in situ of right breast: Secondary | ICD-10-CM | POA: Insufficient documentation

## 2023-05-14 DIAGNOSIS — Z79810 Long term (current) use of selective estrogen receptor modulators (SERMs): Secondary | ICD-10-CM | POA: Diagnosis not present

## 2023-05-14 DIAGNOSIS — M2560 Stiffness of unspecified joint, not elsewhere classified: Secondary | ICD-10-CM | POA: Diagnosis not present

## 2023-05-14 NOTE — Assessment & Plan Note (Addendum)
03/18/2019: Screening mammogram detected indeterminate groups of calcifications 1.5 cm and 0.8 cm biopsy initially showed LCIS.   Right lumpectomy Derrell Lolling): DCIS, intermediate grade, focally involving a small intraductal papilloma, clear margins, no invasive carcinoma, ER+ 100%, PR+ 50 Adjuvant radiation 05/01/2019-05/23/2019   Treatment plan: Tamoxifen 20 mg daily x5 years Tamoxifen toxicities: None   Breast cancer surveillance: 1.  Mammogram 03/02/2023: Benign breast density category B 2. breast exam 05/14/2023: Benign  3.  Bone density 06/2022-normal   Her daughter was diagnosed stomach cancer and is undergoing chemotherapy and possibly surgery coming up at [facility].  She is extremely worried about her.

## 2023-05-14 NOTE — Progress Notes (Signed)
Patient Care Team: [name], MD as PCP - General (Family Medicine) Serena Croissant, MD as Consulting Physician (Hematology and Oncology)  DIAGNOSIS:  Encounter Diagnosis  Name Primary?   Ductal carcinoma in situ (DCIS) of right breast Yes    SUMMARY OF ONCOLOGIC HISTORY: Oncology History  Ductal carcinoma in situ (DCIS) of right breast  02/19/2019 Initial Diagnosis   Routine screening mammogram detected two indeterminate groups of calcifications in the upper right breast measuring 15mm and 8mm. Biopsy ([id number]) showed LCIS with calcifications.    03/18/2019 Surgery   Right lumpectomy Derrell Lolling) [phone]): DCIS, intermediate grade, focally involving a small intraductal papilloma, clear margins, no invasive carcinoma, ER+ 100%, PR+ 50%.    03/18/2019 Cancer Staging   Staging form: Breast, AJCC 8th Edition - Clinical stage from 03/18/2019: Stage 0 (cTis (DCIS), cN0, cM0, ER+, PR+) - Signed by Loa Socks, NP on 08/13/2019   03/18/2019 Cancer Staging   Staging form: Breast, AJCC 8th Edition - Pathologic stage from 03/18/2019: Stage 0 (pTis (DCIS), pN0, cM0, ER+, PR+) - Signed by Loa Socks, NP on 08/13/2019   04/30/2019 - 05/27/2019 Radiation Therapy   Adjuvant radiation therapy The whole right breast was treated to 40.05 Gy in 15 fractions. This was followed by a boost to the seroma of 10 Gy in 5 fractions, for a total dose of 50.04 Gy in 20 fractions.   05/2019 -  Anti-estrogen oral therapy   Tamoxifen 20 mg     CHIEF COMPLIANT: Follow-up on tamoxifen therapy  Discussed the use of AI scribe software for clinical note transcription with the patient, who gave verbal consent to proceed.  History of Present Illness   The patient, diagnosed with DCIS four years ago, has been on tamoxifen therapy since. She reports feeling 'fine' and 'great' with no significant side effects from the medication. She is willing to continue the therapy for another year. She  does report joint stiffness, particularly after sitting for a while, which she attributes to 'old age.' She manages the discomfort with Tylenol and Voltaren gel. The stiffness is primarily in the knees, a condition she attributes to a history of sports activity. She reports that the stiffness improves with movement.  The patient has been diligent with her mammograms, the most recent of which was on July 12th. The mammogram showed that her breasts are not terribly dense, falling into category B. An additional mammogram and ultrasound in March showed no suspicious findings. She also had x-rays of her knee due to arthritis.  The patient's daughter was recently diagnosed with stomach cancer and is currently undergoing chemotherapy. She is extremely worried about her daughter's condition.      ALLERGIES:  has No Known Allergies.  MEDICATIONS:  Current Outpatient Medications  Medication Sig Dispense Refill   acetaminophen (TYLENOL) 500 MG tablet Take 500 mg by mouth every 6 (six) hours as needed for mild pain.     aspirin 81 MG tablet Take 81 mg by mouth daily.     calcium carbonate (CALTRATE 600) 1500 (600 Ca) MG TABS tablet Take 1 tablet (1,500 mg total) by mouth daily with breakfast.  0   Cholecalciferol (VITAMIN D) 50 MCG (2000 UT) CAPS Take 1 capsule (2,000 Units total) by mouth daily. 30 capsule    Lancets (ONETOUCH DELICA PLUS LANCET33G) MISC Apply topically as directed.     metFORMIN (GLUCOPHAGE) 500 MG tablet Take 1 tablet (500 mg total) by mouth daily with breakfast.     ONETOUCH ULTRA test  strip USE AS DIRECTED DAILY FOR TESTING     pravastatin (PRAVACHOL) 40 MG tablet Take 40 mg by mouth daily.     spironolactone-hydrochlorothiazide (ALDACTAZIDE) 25-25 MG per tablet Take 1 tablet by mouth daily.      tamoxifen (NOLVADEX) 20 MG tablet TAKE 1 TABLET BY MOUTH DAILY 100 tablet 2   No current facility-administered medications for this visit.    PHYSICAL EXAMINATION: ECOG PERFORMANCE  STATUS: 1 - Symptomatic but completely ambulatory  Vitals:   05/14/23 1153  BP: (!) 151/63  Pulse: 94  Resp: 17  Temp: 98.8 F (37.1 C)  SpO2: 95%   Filed Weights   05/14/23 1153  Weight: 219 lb 14.4 oz (99.7 kg)      LABORATORY DATA:  I have reviewed the data as listed    Latest Ref Rng & Units 05/31/2022    8:45 PM 10/28/2020    1:56 PM 03/14/2019   12:00 PM  CMP  Glucose 70 - 99 mg/dL 409   811   BUN 8 - 23 mg/dL 22   23   Creatinine 9.14 - 1.00 mg/dL 7.82   9.56   Sodium 213 - 145 mmol/L 135   142   Potassium 3.5 - 5.1 mmol/L 3.3   3.5   Chloride 98 - 111 mmol/L 97   101   CO2 22 - 32 mmol/L 27   29   Calcium 8.9 - 10.3 mg/dL 9.7   9.8   Total Protein 6.5 - 8.1 g/dL 8.1  7.3  7.4   Total Bilirubin 0.3 - 1.2 mg/dL 0.5  <0.8  0.6   Alkaline Phos 38 - 126 U/L 60  61  71   AST 15 - 41 U/L 15  18  19    ALT 0 - 44 U/L 15  15  17      Lab Results  Component Value Date   WBC 13.4 (H) 05/31/2022   HGB 12.2 05/31/2022   HCT 37.4 05/31/2022   MCV 88.4 05/31/2022   PLT 170 05/31/2022   NEUTROABS 12.3 (H) 05/31/2022    ASSESSMENT & PLAN:  Ductal carcinoma in situ (DCIS) of right breast 03/18/2019: Screening mammogram detected indeterminate groups of calcifications 1.5 cm and 0.8 cm biopsy initially showed LCIS.   Right lumpectomy Derrell Lolling): DCIS, intermediate grade, focally involving a small intraductal papilloma, clear margins, no invasive carcinoma, ER+ 100%, PR+ 50 Adjuvant radiation 05/01/2019-05/23/2019   Treatment plan: Tamoxifen 20 mg daily x5 years Tamoxifen toxicities: None   Breast cancer surveillance: 1.  Mammogram 03/02/2023: Benign breast density category B 2. breast exam 05/14/2023: Benign  3.  Bone density 06/2022-normal   Her daughter was diagnosed stomach cancer and is undergoing chemotherapy and possibly surgery coming up at [facility]  She is extremely worried about her.   ------------------------------------- Assessment and Plan    Premalignant  Breast Condition (PCIS) Stable on Tamoxifen for 4 years, tolerating well with no significant side effects. Mammograms up to date with no suspicious findings. -Continue Tamoxifen for another year.  Joint Stiffness Likely due to a combination of age, prior sports activity, and possible Tamoxifen side effect. Using Tylenol and Voltaren gel for relief. -Consider home remedies such as olive oil and lemon peel application for additional relief.  Arthritis Diagnosed via knee x-rays, likely contributing to joint stiffness. -Continue current management with Tylenol and Voltaren gel.  General Health Maintenance Good control of blood sugars. -Continue current lifestyle and medication regimen for diabetes management.  Psychosocial Significant stress related to  daughter's recent stomach cancer diagnosis and treatment. -Consider offering resources for emotional support and counseling.      No orders of the defined types were placed in this encounter.  The patient has a good understanding of the overall plan. she agrees with it. she will call with any problems that may develop before the next visit here. Total time spent: 30 mins including face to face time and time spent for planning, charting and co-ordination of care   Tamsen Meek, MD 05/14/23

## 2023-06-06 ENCOUNTER — Encounter: Payer: Self-pay | Admitting: *Deleted

## 2023-06-21 ENCOUNTER — Telehealth: Payer: Self-pay | Admitting: *Deleted

## 2023-06-21 DIAGNOSIS — Z1211 Encounter for screening for malignant neoplasm of colon: Secondary | ICD-10-CM

## 2023-06-21 NOTE — Telephone Encounter (Signed)
  Procedure: colonoscopy   Height: 5' 4.5" Weight: 218 lb        Have you had a colonoscopy before?  Yes, 2014, Dr.Fields  Do you have family history of colon cancer?  no  Do you have a family history of polyps? yes  Previous colonoscopy with polyps removed? yes  Do you have a history colorectal cancer?   no  Are you diabetic?  yes  Do you have a prosthetic or mechanical heart valve? no  Do you have a pacemaker/defibrillator?   no  Have you had endocarditis/atrial fibrillation?  no  Do you use supplemental oxygen/CPAP?  no  Have you had joint replacement within the last 12 months?  no  Do you tend to be constipated or have to use laxatives?  Occasionally-constipated   Do you have history of alcohol use? If yes, how much and how often.  no  Do you have history or are you using drugs? If yes, what do are you  using?  no  Have you ever had a stroke/heart attack?  no  Have you ever had a heart or other vascular stent placed,?no  Do you take weight loss medication? no  female patients,: have you had a hysterectomy? yes                              are you post menopausal?  No answer                              do you still have your menstrual cycle? no    Date of last menstrual period? unsure  Do you take any blood-thinning medications such as: (Plavix, aspirin, Coumadin, Aggrenox, Brilinta, Xarelto, Eliquis, Pradaxa, Savaysa or Effient)? Aspirin   If yes we need the name, milligram, dosage and who is prescribing doctor:  81 mg             Current Outpatient Medications  Medication Sig Dispense Refill   acetaminophen (TYLENOL) 500 MG tablet Take 500 mg by mouth every 6 (six) hours as needed for mild pain.     aspirin 81 MG tablet Take 81 mg by mouth daily.     calcium carbonate (CALTRATE 600) 1500 (600 Ca) MG TABS tablet Take 1 tablet (1,500 mg total) by mouth daily with breakfast.  0   Cholecalciferol (VITAMIN D) 50 MCG (2000 UT) CAPS Take 1 capsule (2,000 Units  total) by mouth daily. 30 capsule    Lancets (ONETOUCH DELICA PLUS LANCET33G) MISC Apply topically as directed.     metFORMIN (GLUCOPHAGE) 500 MG tablet Take 1 tablet (500 mg total) by mouth daily with breakfast.     ONETOUCH ULTRA test strip USE AS DIRECTED DAILY FOR TESTING     pravastatin (PRAVACHOL) 40 MG tablet Take 40 mg by mouth daily.     spironolactone-hydrochlorothiazide (ALDACTAZIDE) 25-25 MG per tablet Take 1 tablet by mouth daily.      tamoxifen (NOLVADEX) 20 MG tablet TAKE 1 TABLET BY MOUTH DAILY 100 tablet 2   No current facility-administered medications for this visit.    No Known Allergies

## 2023-07-09 DIAGNOSIS — M170 Bilateral primary osteoarthritis of knee: Secondary | ICD-10-CM | POA: Diagnosis not present

## 2023-07-09 DIAGNOSIS — I10 Essential (primary) hypertension: Secondary | ICD-10-CM | POA: Diagnosis not present

## 2023-07-09 DIAGNOSIS — E1165 Type 2 diabetes mellitus with hyperglycemia: Secondary | ICD-10-CM | POA: Diagnosis not present

## 2023-07-24 DIAGNOSIS — N390 Urinary tract infection, site not specified: Secondary | ICD-10-CM | POA: Diagnosis not present

## 2023-07-24 DIAGNOSIS — E1169 Type 2 diabetes mellitus with other specified complication: Secondary | ICD-10-CM | POA: Diagnosis not present

## 2023-07-24 DIAGNOSIS — M170 Bilateral primary osteoarthritis of knee: Secondary | ICD-10-CM | POA: Diagnosis not present

## 2023-07-31 DIAGNOSIS — M25561 Pain in right knee: Secondary | ICD-10-CM | POA: Diagnosis not present

## 2023-07-31 NOTE — Telephone Encounter (Signed)
ASA 2 - okay to schedule  Hold metformin morning of procedure BMP pre-op

## 2023-08-01 MED ORDER — PEG 3350-KCL-NA BICARB-NACL 420 G PO SOLR: 4000.0000 mL | Freq: Once | ORAL | 0 refills | Status: AC

## 2023-08-01 NOTE — Telephone Encounter (Signed)
CALLED PT. Scheduled for 1/28 with Dr. Marletta Lor, aware will send prep to pharmacy. Instructions will be mailed along with lab work needed

## 2023-08-01 NOTE — Addendum Note (Signed)
Addended by: Armstead Peaks on: 08/01/2023 01:33 PM   Modules accepted: Orders

## 2023-08-01 NOTE — Telephone Encounter (Signed)
Questionnaire from recall, no referral needed  

## 2023-09-11 ENCOUNTER — Telehealth: Payer: Self-pay | Admitting: *Deleted

## 2023-09-11 ENCOUNTER — Other Ambulatory Visit (HOSPITAL_COMMUNITY)
Admission: RE | Admit: 2023-09-11 | Discharge: 2023-09-11 | Disposition: A | Discharge status: Home / Self Care | Payer: Medicare Other | Source: Ambulatory Visit | Attending: Internal Medicine | Admitting: Internal Medicine

## 2023-09-11 DIAGNOSIS — Z1211 Encounter for screening for malignant neoplasm of colon: Secondary | ICD-10-CM | POA: Diagnosis not present

## 2023-09-11 LAB — BASIC METABOLIC PANEL
Anion gap: 9 (ref 5–15)
BUN: 16 mg/dL (ref 8–23)
CO2: 29 mmol/L (ref 22–32)
Calcium: 9.4 mg/dL (ref 8.9–10.3)
Chloride: 102 mmol/L (ref 98–111)
Creatinine, Ser: 1.1 mg/dL — ABNORMAL HIGH (ref 0.44–1.00)
GFR, Estimated: 53 mL/min — ABNORMAL LOW (ref >60)
Glucose, Bld: 85 mg/dL (ref 70–99)
Potassium: 3.6 mmol/L (ref 3.5–5.1)
Sodium: 140 mmol/L (ref 135–145)

## 2023-09-11 NOTE — Telephone Encounter (Signed)
Pt left vm stating that she wanted to make [place] aware that she was a diabetic.  Advised pt that we are aware that she is a diabetic, and her instructions have been adjusted and informed pt that she needed to have lab work done prior to procedure. Pt states she is going today.

## 2023-09-18 ENCOUNTER — Ambulatory Visit (HOSPITAL_COMMUNITY): Payer: Medicare Other | Admitting: Anesthesiology

## 2023-09-18 ENCOUNTER — Encounter (HOSPITAL_COMMUNITY): Payer: Self-pay | Admitting: Internal Medicine

## 2023-09-18 ENCOUNTER — Encounter (HOSPITAL_COMMUNITY)
Admission: RE | Disposition: A | Discharge status: Home / Self Care | Payer: Self-pay | Source: Ambulatory Visit | Attending: Internal Medicine

## 2023-09-18 ENCOUNTER — Ambulatory Visit (HOSPITAL_COMMUNITY)
Admission: RE | Admit: 2023-09-18 | Discharge: 2023-09-18 | Disposition: A | Discharge status: Home / Self Care | Payer: Medicare Other | Source: Ambulatory Visit | Attending: Internal Medicine | Admitting: Internal Medicine

## 2023-09-18 ENCOUNTER — Other Ambulatory Visit: Payer: Self-pay

## 2023-09-18 DIAGNOSIS — E119 Type 2 diabetes mellitus without complications: Secondary | ICD-10-CM | POA: Insufficient documentation

## 2023-09-18 DIAGNOSIS — D123 Benign neoplasm of transverse colon: Secondary | ICD-10-CM | POA: Diagnosis not present

## 2023-09-18 DIAGNOSIS — I10 Essential (primary) hypertension: Secondary | ICD-10-CM | POA: Insufficient documentation

## 2023-09-18 DIAGNOSIS — K635 Polyp of colon: Secondary | ICD-10-CM

## 2023-09-18 DIAGNOSIS — D124 Benign neoplasm of descending colon: Secondary | ICD-10-CM | POA: Diagnosis not present

## 2023-09-18 DIAGNOSIS — Z853 Personal history of malignant neoplasm of breast: Secondary | ICD-10-CM | POA: Diagnosis not present

## 2023-09-18 DIAGNOSIS — Z7984 Long term (current) use of oral hypoglycemic drugs: Secondary | ICD-10-CM | POA: Insufficient documentation

## 2023-09-18 DIAGNOSIS — Z139 Encounter for screening, unspecified: Secondary | ICD-10-CM | POA: Diagnosis not present

## 2023-09-18 DIAGNOSIS — Z1211 Encounter for screening for malignant neoplasm of colon: Secondary | ICD-10-CM

## 2023-09-18 DIAGNOSIS — Z923 Personal history of irradiation: Secondary | ICD-10-CM | POA: Insufficient documentation

## 2023-09-18 DIAGNOSIS — K5730 Diverticulosis of large intestine without perforation or abscess without bleeding: Secondary | ICD-10-CM | POA: Diagnosis not present

## 2023-09-18 DIAGNOSIS — Z6837 Body mass index (BMI) 37.0-37.9, adult: Secondary | ICD-10-CM | POA: Insufficient documentation

## 2023-09-18 DIAGNOSIS — E669 Obesity, unspecified: Secondary | ICD-10-CM | POA: Diagnosis not present

## 2023-09-18 HISTORY — PX: COLONOSCOPY WITH PROPOFOL: SHX5780

## 2023-09-18 HISTORY — PX: POLYPECTOMY: SHX149

## 2023-09-18 LAB — GLUCOSE, CAPILLARY: Glucose-Capillary: 88 mg/dL (ref 70–99)

## 2023-09-18 SURGERY — COLONOSCOPY WITH PROPOFOL
Anesthesia: General

## 2023-09-18 MED ORDER — PROPOFOL 10 MG/ML IV BOLUS
INTRAVENOUS | Status: DC | PRN
  Administered 2023-09-18: 80 mg via INTRAVENOUS

## 2023-09-18 MED ORDER — PROPOFOL 500 MG/50ML IV EMUL
INTRAVENOUS | Status: DC | PRN
  Administered 2023-09-18: 200 ug/kg/min via INTRAVENOUS

## 2023-09-18 MED ORDER — LIDOCAINE HCL (CARDIAC) PF 100 MG/5ML IV SOSY
PREFILLED_SYRINGE | INTRAVENOUS | Status: DC | PRN
  Administered 2023-09-18: 50 mg via INTRAVENOUS

## 2023-09-18 MED ORDER — LACTATED RINGERS IV SOLN: INTRAVENOUS | Status: DC | PRN

## 2023-09-18 NOTE — Op Note (Signed)
[facility] Patient Name: [name] Procedure Date: 09/18/2023 9:33 AM MRN: [id number] Date of Birth: Aug 08, 1951 Attending MD: Hennie Duos. Marletta Lor , [place], [id number] CSN: [id number] Age: 73 Admit Type: Outpatient Procedure:                Colonoscopy Indications:              Screening for colorectal malignant neoplasm Providers:                Hennie Duos. Marletta Lor, DO, Crystal Page, Durwin Glaze Tech, Technician Referring MD:              Medicines:                See the Anesthesia note for documentation of the                            administered medications Complications:            No immediate complications. Estimated Blood Loss:     Estimated blood loss was minimal. Procedure:                Pre-Anesthesia Assessment:                           - The anesthesia plan was to use monitored                            anesthesia care (MAC).                           After obtaining informed consent, the colonoscope                            was passed under direct vision. Throughout the                            procedure, the patient's blood pressure, pulse, and                            oxygen saturations were monitored continuously. The                            [id number]-HQ190L ([id number]) scope was introduced through                            the anus and advanced to the the cecum, identified                            by appendiceal orifice and ileocecal valve. The                            colonoscopy was performed without difficulty. The                            patient tolerated the procedure well.  The quality                            of the bowel preparation was evaluated using the                            BBPS [facility] Bowel Preparation Scale) with scores                            of: Right Colon = 3, Transverse Colon = 3 and Left                            Colon = 3 (entire mucosa seen well with no residual                             staining, small fragments of stool or opaque                            liquid). The total BBPS score equals 9. Scope In: 9:56:01 AM Scope Out: 10:13:39 AM Scope Withdrawal Time: 0 hours 14 minutes 52 seconds  Total Procedure Duration: 0 hours 17 minutes 38 seconds  Findings:      Multiple medium-mouthed and small-mouthed diverticula were found in the       sigmoid colon, descending colon and transverse colon.      Two sessile polyps were found in the descending colon and transverse       colon. The polyps were 3 to 6 mm in size. These polyps were removed with       a cold snare. Resection and retrieval were complete.      A 6 mm polyp was found in the sigmoid colon. The polyp was sessile. The       polyp was removed with a cold snare. Resection and retrieval were       complete.      The exam was otherwise without abnormality. Impression:               - Diverticulosis in the sigmoid colon, in the                            descending colon and in the transverse colon.                           - Two 3 to 6 mm polyps in the descending colon and                            in the transverse colon, removed with a cold snare.                            Resected and retrieved.                           - One 6 mm polyp in the sigmoid colon, removed with  a cold snare. Resected and retrieved.                           - The examination was otherwise normal. Moderate Sedation:      Per Anesthesia Care Recommendation:           - Patient has a contact number available for                            emergencies. The signs and symptoms of potential                            delayed complications were discussed with the                            patient. Return to normal activities tomorrow.                            Written discharge instructions were provided to the                            patient.                           - Resume previous diet.                            - Continue present medications.                           - Await pathology results.                           - No repeat colonoscopy due to age.                           - Return to GI clinic PRN. Procedure Code(s):        --- Professional ---                           [phone], Colonoscopy, flexible; with removal of                            tumor(s), polyp(s), or other lesion(s) by snare                            technique Diagnosis Code(s):        --- Professional ---                           Z12.11, Encounter for screening for malignant                            neoplasm of colon                           D12.4, Benign neoplasm of descending colon  D12.3, Benign neoplasm of transverse colon (hepatic                            flexure or splenic flexure)                           D12.5, Benign neoplasm of sigmoid colon                           K57.30, Diverticulosis of large intestine without                            perforation or abscess without bleeding CPT copyright 2022 American Medical Association. All rights reserved. The codes documented in this report are preliminary and upon coder review may  be revised to meet current compliance requirements. Hennie Duos. Marletta Lor, DO Hennie Duos. Marletta Lor, DO 09/18/2023 10:17:38 AM This report has been signed electronically. Number of Addenda: 0

## 2023-09-18 NOTE — Transfer of Care (Signed)
Immediate Anesthesia Transfer of Care Note  Patient: [name]  Procedure(s) Performed: COLONOSCOPY WITH PROPOFOL POLYPECTOMY INTESTINAL  Patient Location: Short Stay  Anesthesia Type:General  Level of Consciousness: awake, alert , oriented, and patient cooperative  Airway & Oxygen Therapy: Patient Spontanous Breathing  Post-op Assessment: Report given to RN, Post -op Vital signs reviewed and stable, and Patient moving all extremities X 4  Post vital signs: Reviewed and stable  Last Vitals:  Vitals Value Taken Time  BP 100/52 09/18/23 1017  Temp 36.5 C 09/18/23 1017  Pulse 58 09/18/23 1017  Resp 16 09/18/23 1017  SpO2 96 % 09/18/23 1017    Last Pain:  Vitals:   09/18/23 1017  TempSrc: Oral  PainSc:       Patients Stated Pain Goal: 4 (09/18/23 0906)  Complications: No notable events documented.

## 2023-09-18 NOTE — Anesthesia Preprocedure Evaluation (Signed)
Anesthesia Evaluation  Patient identified by MRN, date of birth, ID band Patient awake    Reviewed: Allergy & Precautions, H&P , NPO status , Patient's Chart, lab work & pertinent test results, reviewed documented beta blocker date and time   Airway Mallampati: II  TM Distance: >3 FB Neck ROM: full    Dental no notable dental hx.    Pulmonary neg pulmonary ROS   Pulmonary exam normal breath sounds clear to auscultation       Cardiovascular Exercise Tolerance: Good hypertension, negative cardio ROS  Rhythm:regular Rate:Normal     Neuro/Psych negative neurological ROS  negative psych ROS   GI/Hepatic negative GI ROS, Neg liver ROS,,,  Endo/Other  negative endocrine ROSdiabetes    Renal/GU negative Renal ROS  negative genitourinary   Musculoskeletal   Abdominal   Peds  Hematology negative hematology ROS (+)   Anesthesia Other Findings   Reproductive/Obstetrics negative OB ROS                             Anesthesia Physical Anesthesia Plan  ASA: 2  Anesthesia Plan: General   Post-op Pain Management:    Induction:   PONV Risk Score and Plan: Propofol infusion  Airway Management Planned:   Additional Equipment:   Intra-op Plan:   Post-operative Plan:   Informed Consent: I have reviewed the patients History and Physical, chart, labs and discussed the procedure including the risks, benefits and alternatives for the proposed anesthesia with the patient or authorized representative who has indicated his/her understanding and acceptance.     Dental Advisory Given  Plan Discussed with: CRNA  Anesthesia Plan Comments:        Anesthesia Quick Evaluation

## 2023-09-18 NOTE — H&P (Signed)
Primary Care Physician:  [name], MD Primary Gastroenterologist:  Dr. Marletta Lor  Pre-Procedure History & Physical: HPI:  [name] is a 73 y.o. female is here for a colonoscopy for colon cancer screening purposes.   Past Medical History:  Diagnosis Date   Diabetes mellitus without complication (HCC)    Hyperlipidemia    Hypertension    Lobular carcinoma in situ (LCIS) of right breast 03/18/2019   Obesity    Personal history of radiation therapy    Stasis dermatitis    Venous insufficiency     Past Surgical History:  Procedure Laterality Date   ABDOMINAL HYSTERECTOMY     BREAST LUMPECTOMY Right 03/18/2019   BREAST LUMPECTOMY WITH RADIOACTIVE SEED LOCALIZATION Right 03/18/2019   Procedure: RIGHT BREAST LUMPECTOMY X 2  WITH RIGHT RADIOACTIVE SEED LOCALIZATION X 2;  Surgeon: Claud Kelp, MD;  Location: [facility];  Service: General;  Laterality: Right;   CHOLECYSTECTOMY     COLONOSCOPY N/A 07/02/2013   Procedure: COLONOSCOPY;  Surgeon: West Bali, MD;  Location: [facility];  Service: Endoscopy;  Laterality: N/A;  9:15 AM    Prior to Admission medications   Medication Sig Start Date End Date Taking? Authorizing Provider  acetaminophen (TYLENOL) 500 MG tablet Take 500 mg by mouth every 6 (six) hours as needed for mild pain.   Yes [provider]  aspirin 81 MG tablet Take 81 mg by mouth daily.   Yes [provider]  calcium carbonate (CALTRATE 600) 1500 (600 Ca) MG TABS tablet Take 1 tablet (1,500 mg total) by mouth daily with breakfast. 04/19/20  Yes Serena Croissant, MD  Cholecalciferol (VITAMIN D) 50 MCG (2000 UT) CAPS Take 1 capsule (2,000 Units total) by mouth daily. 04/19/20  Yes Serena Croissant, MD  Lancets [facility] DELICA PLUS [place]) MISC Apply topically as directed. 10/06/20  Yes [provider]  metFORMIN (GLUCOPHAGE) 500 MG tablet Take 1 tablet (500 mg total) by mouth daily with breakfast. 05/23/19  Yes Serena Croissant, MD  pravastatin (PRAVACHOL) 40 MG tablet Take 40 mg by mouth daily.   Yes [provider]  spironolactone-hydrochlorothiazide (ALDACTAZIDE) 25-25 MG per tablet Take 1 tablet by mouth daily.    Yes [provider]  tamoxifen (NOLVADEX) 20 MG tablet TAKE 1 TABLET BY MOUTH DAILY 05/06/23  Yes Causey, Larna Daughters, NP  [facility] ULTRA test strip USE AS DIRECTED DAILY FOR TESTING 04/28/22   [provider]    Allergies as of 08/01/2023   (No Known Allergies)    Family History  Problem Relation Age of Onset   Diabetes Mother    Heart disease Mother    Hypertension Mother    Cancer Father    Colon cancer Neg Hx     Social History   Socioeconomic History   Marital status: Married    Spouse name: Not on file   Number of children: Not on file   Years of education: Not on file   Highest education level: Not on file  Occupational History   Not on file  Tobacco Use   Smoking status: Never   Smokeless tobacco: Never  Substance and Sexual Activity   Alcohol use: No   Drug use: No   Sexual activity: Not on file  Other Topics Concern   Not on file  Social History Narrative   Not on file   Social Drivers of Health   Financial Resource Strain: Not on file  Food Insecurity: Not on file  Transportation Needs: Not on file  Physical Activity: Not on file  Stress: Not on file  Social Connections: Not on file  Intimate Partner Violence: Not on file    Review of Systems: See HPI, otherwise negative ROS  Physical Exam: Vital signs in last 24 hours: Temp:  [98.6 F (37 C)] 98.6 F (37 C) (01/28 0906) Pulse Rate:  [78] 78 (01/28 0906) Resp:  [16] 16 (01/28 0906) BP: (138)/(72) 138/72 (01/28 0906) SpO2:  [100 %] 100 % (01/28 0906) Weight:  [99.3 kg] 99.3 kg (01/28 0906)   General:   Alert,  Well-developed, well-nourished, pleasant and cooperative in NAD Head:  Normocephalic and atraumatic. Eyes:  Sclera clear, no icterus.   Conjunctiva  pink. Ears:  Normal auditory acuity. Nose:  No deformity, discharge,  or lesions. Msk:  Symmetrical without gross deformities. Normal posture. Extremities:  Without clubbing or edema. Neurologic:  Alert and  oriented x4;  grossly normal neurologically. Skin:  Intact without significant lesions or rashes. Psych:  Alert and cooperative. Normal mood and affect.  Impression/Plan: [name] is here for a colonoscopy to be performed for colon cancer screening purposes.  The risks of the procedure including infection, bleed, or perforation as well as benefits, limitations, alternatives and imponderables have been reviewed with the patient. Questions have been answered. All parties agreeable.

## 2023-09-18 NOTE — Discharge Instructions (Addendum)
  Colonoscopy Discharge Instructions  Read the instructions outlined below and refer to this sheet in the next few weeks. These discharge instructions provide you with general information on caring for yourself after you leave the hospital. Your doctor may also give you specific instructions. While your treatment has been planned according to the most current medical practices available, unavoidable complications occasionally occur.   ACTIVITY You may resume your regular activity, but move at a slower pace for the next 24 hours.  Take frequent rest periods for the next 24 hours.  Walking will help get rid of the air and reduce the bloated feeling in your belly (abdomen).  No driving for 24 hours (because of the medicine (anesthesia) used during the test).   Do not sign any important legal documents or operate any machinery for 24 hours (because of the anesthesia used during the test).  NUTRITION Drink plenty of fluids.  You may resume your normal diet as instructed by your doctor.  Begin with a light meal and progress to your normal diet. Heavy or fried foods are harder to digest and may make you feel sick to your stomach (nauseated).  Avoid alcoholic beverages for 24 hours or as instructed.  MEDICATIONS You may resume your normal medications unless your doctor tells you otherwise.  WHAT YOU CAN EXPECT TODAY Some feelings of bloating in the abdomen.  Passage of more gas than usual.  Spotting of blood in your stool or on the toilet paper.  IF YOU HAD POLYPS REMOVED DURING THE COLONOSCOPY: No aspirin products for 7 days or as instructed.  No alcohol for 7 days or as instructed.  Eat a soft diet for the next 24 hours.  FINDING OUT THE RESULTS OF YOUR TEST Not all test results are available during your visit. If your test results are not back during the visit, make an appointment with your caregiver to find out the results. Do not assume everything is normal if you have not heard from your  caregiver or the medical facility. It is important for you to follow up on all of your test results.  SEEK IMMEDIATE MEDICAL ATTENTION IF: You have more than a spotting of blood in your stool.  Your belly is swollen (abdominal distention).  You are nauseated or vomiting.  You have a temperature over 101.  You have abdominal pain or discomfort that is severe or gets worse throughout the day.   Your colonoscopy revealed 3 small polyp(s) which I removed successfully. Await pathology results, my office will contact you.  Given your age, I do not think we need to perform further colonoscopies for polyp surveillance.  You also have diverticulosis. I would recommend increasing fiber in your diet or adding OTC Benefiber/Metamucil. Be sure to drink at least 4 to 6 glasses of water daily. Follow-up with GI as needed.   I hope you have a great rest of your week!  Hennie Duos. Marletta Lor, D.O. Gastroenterology and Hepatology [facility]

## 2023-09-19 ENCOUNTER — Encounter (HOSPITAL_COMMUNITY): Payer: Self-pay | Admitting: Internal Medicine

## 2023-09-19 LAB — SURGICAL PATHOLOGY

## 2023-09-19 NOTE — Anesthesia Postprocedure Evaluation (Signed)
Anesthesia Post Note  Patient: [name]  Procedure(s) Performed: COLONOSCOPY WITH PROPOFOL POLYPECTOMY INTESTINAL  Patient location during evaluation: Phase II Anesthesia Type: General Level of consciousness: awake Pain management: pain level controlled Vital Signs Assessment: post-procedure vital signs reviewed and stable Respiratory status: spontaneous breathing and respiratory function stable Cardiovascular status: blood pressure returned to baseline and stable Postop Assessment: no headache and no apparent nausea or vomiting Anesthetic complications: no Comments: Late entry   No notable events documented.   Last Vitals:  Vitals:   09/18/23 1017 09/18/23 1023  BP: (!) 100/52 118/69  Pulse: (!) 58 63  Resp: 16   Temp: 36.5 C   SpO2: 96%     Last Pain:  Vitals:   09/18/23 1017  TempSrc: Oral  PainSc:                  Windell Norfolk

## 2023-10-22 DIAGNOSIS — E1169 Type 2 diabetes mellitus with other specified complication: Secondary | ICD-10-CM | POA: Diagnosis not present

## 2023-10-22 DIAGNOSIS — I10 Essential (primary) hypertension: Secondary | ICD-10-CM | POA: Diagnosis not present

## 2023-10-22 DIAGNOSIS — E7800 Pure hypercholesterolemia, unspecified: Secondary | ICD-10-CM | POA: Diagnosis not present

## 2023-10-22 DIAGNOSIS — M170 Bilateral primary osteoarthritis of knee: Secondary | ICD-10-CM | POA: Diagnosis not present

## 2023-11-06 ENCOUNTER — Telehealth: Payer: Self-pay | Admitting: Adult Health

## 2023-11-06 NOTE — Telephone Encounter (Signed)
 Rescheduled appointments per patients request via incoming call. Talked with the patient and she is aware of the changes made to her upcoming appointment.

## 2023-11-12 ENCOUNTER — Ambulatory Visit: Payer: Medicare Other | Admitting: Adult Health

## 2023-11-13 ENCOUNTER — Encounter: Payer: Self-pay | Admitting: Adult Health

## 2023-11-13 ENCOUNTER — Inpatient Hospital Stay: Attending: Adult Health | Admitting: Adult Health

## 2023-11-13 VITALS — BP 148/69 | HR 81 | Temp 98.1°F | Resp 18 | Ht 64.5 in | Wt 220.4 lb

## 2023-11-13 DIAGNOSIS — Z923 Personal history of irradiation: Secondary | ICD-10-CM | POA: Insufficient documentation

## 2023-11-13 DIAGNOSIS — I10 Essential (primary) hypertension: Secondary | ICD-10-CM | POA: Diagnosis not present

## 2023-11-13 DIAGNOSIS — D0511 Intraductal carcinoma in situ of right breast: Secondary | ICD-10-CM | POA: Insufficient documentation

## 2023-11-13 DIAGNOSIS — Z79810 Long term (current) use of selective estrogen receptor modulators (SERMs): Secondary | ICD-10-CM | POA: Diagnosis not present

## 2023-11-13 DIAGNOSIS — Z800 Family history of malignant neoplasm of digestive organs: Secondary | ICD-10-CM | POA: Insufficient documentation

## 2023-11-13 DIAGNOSIS — E785 Hyperlipidemia, unspecified: Secondary | ICD-10-CM | POA: Diagnosis not present

## 2023-11-13 NOTE — Assessment & Plan Note (Signed)
 03/18/2019: Screening mammogram detected indeterminate groups of calcifications 1.5 cm and 0.8 cm biopsy initially showed LCIS.   Right lumpectomy Derrell Lolling): DCIS, intermediate grade, focally involving a small intraductal papilloma, clear margins, no invasive carcinoma, ER+ 100%, PR+ 50 Adjuvant radiation 05/01/2019-05/23/2019   Treatment plan: Tamoxifen 20 mg daily x5 years Tamoxifen toxicities: None   Ductal Carcinoma In Situ (DCIS) No evidence of malignancy on recent mammogram. No signs of recurrence. Tamoxifen effective with no issues. Planned discontinuation in October 2025. - Continue tamoxifen until October 2025. - Schedule mammogram in July 2025. - Follow up with Dr. Georgiann Mohs in six months. - Discontinue tamoxifen in October 2025 after consultation with Dr. Georgiann Mohs.  Dietary and Lifestyle Counseling Incorporating fruits and vegetables, reduced meat consumption. Uses calcium supplement instead of milk. - Provide recipe guide with information on calcium-rich vegetables and fruits.

## 2023-11-13 NOTE — Progress Notes (Signed)
 [name] Follow up:    Sara Mires, MD [address] [place] [address]   DIAGNOSIS:  Cancer Staging  Ductal carcinoma in situ (DCIS) of right breast Staging form: Breast, AJCC 8th Edition - Clinical stage from 03/18/2019: Stage 0 (cTis (DCIS), cN0, cM0, ER+, PR+) - Signed by Loa Socks, NP on 08/13/2019 Stage prefix: Initial diagnosis - Pathologic stage from 03/18/2019: Stage 0 (pTis (DCIS), pN0, cM0, ER+, PR+) - Signed by Loa Socks, NP on 08/13/2019   SUMMARY OF ONCOLOGIC HISTORY: Oncology History  Ductal carcinoma in situ (DCIS) of right breast  02/19/2019 Initial Diagnosis   Routine screening mammogram detected two indeterminate groups of calcifications in the upper right breast measuring 15mm and 8mm. Biopsy ([id number]) showed LCIS with calcifications.    03/18/2019 Surgery   Right lumpectomy Derrell Lolling) [phone]): DCIS, intermediate grade, focally involving a small intraductal papilloma, clear margins, no invasive carcinoma, ER+ 100%, PR+ 50%.    03/18/2019 Cancer Staging   Staging form: Breast, AJCC 8th Edition - Clinical stage from 03/18/2019: Stage 0 (cTis (DCIS), cN0, cM0, ER+, PR+) - Signed by Loa Socks, NP on 08/13/2019   03/18/2019 Cancer Staging   Staging form: Breast, AJCC 8th Edition - Pathologic stage from 03/18/2019: Stage 0 (pTis (DCIS), pN0, cM0, ER+, PR+) - Signed by Loa Socks, NP on 08/13/2019   04/30/2019 - 05/27/2019 Radiation Therapy   Adjuvant radiation therapy The whole right breast was treated to 40.05 Gy in 15 fractions. This was followed by a boost to the seroma of 10 Gy in 5 fractions, for a total dose of 50.04 Gy in 20 fractions.   05/2019 -  Anti-estrogen oral therapy   Tamoxifen 20 mg     CURRENT THERAPY: Tamoxifen  INTERVAL HISTORY:  Discussed the use of AI scribe software for clinical note transcription with the patient, who gave verbal consent to  proceed.  [name] 73 y.o. female with a history of DCIS, presents for a routine follow-up. Her most recent mammogram in July 2024 showed no evidence of malignancy. She reports no new breast changes or symptoms since her last visit. She has been active, taking care of her daughter who was diagnosed with cancer last year and is now cancer-free. She has been able to maintain regular exercise, including housework and climbing stairs multiple times a day. She has also been adhering to her medication regimen, including Tamoxifen, without any reported problems. She has been incorporating fruits and vegetables into her daily diet and has reduced her meat intake. She also reports having completed colon cancer screening last year.   Patient Active Problem List   Diagnosis Date Noted   Ductal carcinoma in situ (DCIS) of right breast 03/18/2019   Lymphedema 03/28/2013   HYPERLIPIDEMIA-MIXED 02/08/2009   HYPERTENSION, MALIGNANT, UNCONTROLLED 02/08/2009   Venous (peripheral) insufficiency 02/08/2009   EDEMA 02/08/2009   CHEST PAIN-UNSPECIFIED 02/08/2009    has no known allergies.  MEDICAL HISTORY: Past Medical History:  Diagnosis Date   Diabetes mellitus without complication (HCC)    Hyperlipidemia    Hypertension    Lobular carcinoma in situ (LCIS) of right breast 03/18/2019   Obesity    Personal history of radiation therapy    Stasis dermatitis    Venous insufficiency     SURGICAL HISTORY: Past Surgical History:  Procedure Laterality Date   ABDOMINAL HYSTERECTOMY     BREAST LUMPECTOMY Right 03/18/2019   BREAST LUMPECTOMY WITH RADIOACTIVE SEED LOCALIZATION  Right 03/18/2019   Procedure: RIGHT BREAST LUMPECTOMY X 2  WITH RIGHT RADIOACTIVE SEED LOCALIZATION X 2;  Surgeon: Claud Kelp, MD;  [facility];  Service: General;  Laterality: Right;   CHOLECYSTECTOMY     COLONOSCOPY N/A 07/02/2013   Procedure: COLONOSCOPY;  Surgeon: West Bali, MD;   Location: [facility];  Service: Endoscopy;  Laterality: N/A;  9:15 AM   COLONOSCOPY WITH PROPOFOL N/A 09/18/2023   Procedure: COLONOSCOPY WITH PROPOFOL;  Surgeon: Lanelle Bal, DO;  Location: [facility];  Service: Endoscopy;  Laterality: N/A;  10:15am, asa 2   POLYPECTOMY  09/18/2023   Procedure: POLYPECTOMY INTESTINAL;  Surgeon: Lanelle Bal, DO;  Location: [facility];  Service: Endoscopy;;    SOCIAL HISTORY: Social History   Socioeconomic History   Marital status: Married    Spouse name: Not on file   Number of children: Not on file   Years of education: Not on file   Highest education level: Not on file  Occupational History   Not on file  Tobacco Use   Smoking status: Never   Smokeless tobacco: Never  Substance and Sexual Activity   Alcohol use: No   Drug use: No   Sexual activity: Not on file  Other Topics Concern   Not on file  Social History Narrative   Not on file   Social Drivers of Health   Financial Resource Strain: Not on file  Food Insecurity: Not on file  Transportation Needs: Not on file  Physical Activity: Not on file  Stress: Not on file  Social Connections: Not on file  Intimate Partner Violence: Not on file    FAMILY HISTORY: Family History  Problem Relation Age of Onset   Diabetes Mother    Heart disease Mother    Hypertension Mother    Cancer Father    Colon cancer Neg Hx     Review of Systems  Constitutional:  Negative for appetite change, chills, fatigue, fever and unexpected weight change.  HENT:   Negative for hearing loss, lump/mass and trouble swallowing.   Eyes:  Negative for eye problems and icterus.  Respiratory:  Negative for chest tightness, cough and shortness of breath.   Cardiovascular:  Negative for chest pain, leg swelling and palpitations.  Gastrointestinal:  Negative for abdominal distention, abdominal pain, constipation, diarrhea, nausea and vomiting.  Endocrine: Negative for hot flashes.   Genitourinary:  Negative for difficulty urinating.   Musculoskeletal:  Negative for arthralgias.  Skin:  Negative for itching and rash.  Neurological:  Negative for dizziness, extremity weakness, headaches and numbness.  Hematological:  Negative for adenopathy. Does not bruise/bleed easily.  Psychiatric/Behavioral:  Negative for depression. The patient is not nervous/anxious.       PHYSICAL EXAMINATION    Vitals:   11/13/23 1046  BP: (!) 148/69  Pulse: 81  Resp: 18  Temp: 98.1 F (36.7 C)  SpO2: 100%    Physical Exam Constitutional:      General: She is not in acute distress.    Appearance: Normal appearance. She is not toxic-appearing.  HENT:     Head: Normocephalic and atraumatic.     Mouth/Throat:     Mouth: Mucous membranes are moist.     Pharynx: Oropharynx is clear. No oropharyngeal exudate or posterior oropharyngeal erythema.  Eyes:     General: No scleral icterus. Cardiovascular:     Rate and Rhythm: Normal rate and regular rhythm.     Pulses: Normal  pulses.     Heart sounds: Normal heart sounds.  Pulmonary:     Effort: Pulmonary effort is normal.     Breath sounds: Normal breath sounds.  Chest:     Comments: Right breast status postlumpectomy and radiation no sign of local recurrence left breast is benign Abdominal:     General: Abdomen is flat. Bowel sounds are normal. There is no distension.     Palpations: Abdomen is soft.     Tenderness: There is no abdominal tenderness.  Musculoskeletal:        General: No swelling.     Cervical back: Neck supple.  Lymphadenopathy:     Cervical: No cervical adenopathy.     Upper Body:     Right upper body: No supraclavicular or axillary adenopathy.     Left upper body: No supraclavicular or axillary adenopathy.  Skin:    General: Skin is warm and dry.     Findings: No rash.  Neurological:     General: No focal deficit present.     Mental Status: She is alert.  Psychiatric:        Mood and Affect: Mood  normal.        Behavior: Behavior normal.     LABORATORY DATA:  CBC    Component Value Date/Time   WBC 13.4 (H) 05/31/2022 2045   RBC 4.23 05/31/2022 2045   HGB 12.2 05/31/2022 2045   HCT 37.4 05/31/2022 2045   PLT 170 05/31/2022 2045   MCV 88.4 05/31/2022 2045   MCH 28.8 05/31/2022 2045   MCHC 32.6 05/31/2022 2045   RDW 12.9 05/31/2022 2045   LYMPHSABS 0.7 05/31/2022 2045   MONOABS 0.3 05/31/2022 2045   EOSABS 0.1 05/31/2022 2045   BASOSABS 0.0 05/31/2022 2045    CMP     Component Value Date/Time   NA 140 09/11/2023 1355   K 3.6 09/11/2023 1355   CL 102 09/11/2023 1355   CO2 29 09/11/2023 1355   GLUCOSE 85 09/11/2023 1355   BUN 16 09/11/2023 1355   CREATININE 1.10 (H) 09/11/2023 1355   CALCIUM 9.4 09/11/2023 1355   PROT 8.1 05/31/2022 2045   PROT 7.3 10/28/2020 1356   ALBUMIN 4.4 05/31/2022 2045   ALBUMIN 4.0 10/28/2020 1356   AST 15 05/31/2022 2045   ALT 15 05/31/2022 2045   ALKPHOS 60 05/31/2022 2045   BILITOT 0.5 05/31/2022 2045   BILITOT <0.2 10/28/2020 1356   GFRNONAA 53 (L) 09/11/2023 1355   GFRAA 47 (L) 03/14/2019 1200          ASSESSMENT and THERAPY PLAN:   Ductal carcinoma in situ (DCIS) of right breast 03/18/2019: Screening mammogram detected indeterminate groups of calcifications 1.5 cm and 0.8 cm biopsy initially showed LCIS.   Right lumpectomy Derrell Lolling): DCIS, intermediate grade, focally involving a small intraductal papilloma, clear margins, no invasive carcinoma, ER+ 100%, PR+ 50 Adjuvant radiation 05/01/2019-05/23/2019   Treatment plan: Tamoxifen 20 mg daily x5 years Tamoxifen toxicities: None   Ductal Carcinoma In Situ (DCIS) No evidence of malignancy on recent mammogram. No signs of recurrence. Tamoxifen effective with no issues. Planned discontinuation in October 2025. - Continue tamoxifen until October 2025. - Schedule mammogram in July 2025. - Follow up with Dr. Georgiann Mohs in six months. - Discontinue tamoxifen in October 2025 after  consultation with Dr. Georgiann Mohs.  Dietary and Lifestyle Counseling Incorporating fruits and vegetables, reduced meat consumption. Uses calcium supplement instead of milk. - Provide recipe guide with information on calcium-rich vegetables and fruits.  All questions were answered. The patient knows to call the clinic with any problems, questions or concerns. We can certainly see the patient much sooner if necessary.  Total encounter time:20 minutes*in face-to-face visit time, chart review, lab review, care coordination, order entry, and documentation of the encounter time.   Lillard Anes, NP 11/13/23 3:11 PM Medical Oncology and Hematology [facility] [address] [place], [place] [address] Tel. [phone]    Fax. [phone]  *Total Encounter Time as defined by the Centers for Medicare and Medicaid Services includes, in addition to the face-to-face time of a patient visit (documented in the note above) non-face-to-face time: obtaining and reviewing outside history, ordering and reviewing medications, tests or procedures, care coordination (communications with other health care professionals or caregivers) and documentation in the medical record.

## 2024-01-08 DIAGNOSIS — E1165 Type 2 diabetes mellitus with hyperglycemia: Secondary | ICD-10-CM | POA: Diagnosis not present

## 2024-01-08 DIAGNOSIS — M25562 Pain in left knee: Secondary | ICD-10-CM | POA: Diagnosis not present

## 2024-01-08 DIAGNOSIS — R600 Localized edema: Secondary | ICD-10-CM | POA: Diagnosis not present

## 2024-01-08 DIAGNOSIS — I10 Essential (primary) hypertension: Secondary | ICD-10-CM | POA: Diagnosis not present

## 2024-01-08 DIAGNOSIS — M25561 Pain in right knee: Secondary | ICD-10-CM | POA: Diagnosis not present

## 2024-01-08 DIAGNOSIS — I872 Venous insufficiency (chronic) (peripheral): Secondary | ICD-10-CM | POA: Diagnosis not present

## 2024-02-01 ENCOUNTER — Other Ambulatory Visit: Payer: Self-pay | Admitting: Family Medicine

## 2024-02-01 DIAGNOSIS — Z1231 Encounter for screening mammogram for malignant neoplasm of breast: Secondary | ICD-10-CM

## 2024-02-04 DIAGNOSIS — I10 Essential (primary) hypertension: Secondary | ICD-10-CM | POA: Diagnosis not present

## 2024-02-04 DIAGNOSIS — E1169 Type 2 diabetes mellitus with other specified complication: Secondary | ICD-10-CM | POA: Diagnosis not present

## 2024-02-07 DIAGNOSIS — L650 Telogen effluvium: Secondary | ICD-10-CM | POA: Diagnosis not present

## 2024-02-11 DIAGNOSIS — G4762 Sleep related leg cramps: Secondary | ICD-10-CM | POA: Diagnosis not present

## 2024-02-16 ENCOUNTER — Other Ambulatory Visit: Payer: Self-pay | Admitting: Adult Health

## 2024-02-16 DIAGNOSIS — D0511 Intraductal carcinoma in situ of right breast: Secondary | ICD-10-CM

## 2024-02-19 DIAGNOSIS — I10 Essential (primary) hypertension: Secondary | ICD-10-CM | POA: Diagnosis not present

## 2024-02-19 DIAGNOSIS — R600 Localized edema: Secondary | ICD-10-CM | POA: Diagnosis not present

## 2024-02-19 DIAGNOSIS — I872 Venous insufficiency (chronic) (peripheral): Secondary | ICD-10-CM | POA: Diagnosis not present

## 2024-02-19 DIAGNOSIS — E1169 Type 2 diabetes mellitus with other specified complication: Secondary | ICD-10-CM | POA: Diagnosis not present

## 2024-02-19 DIAGNOSIS — R1013 Epigastric pain: Secondary | ICD-10-CM | POA: Diagnosis not present

## 2024-03-03 ENCOUNTER — Ambulatory Visit
Admission: RE | Admit: 2024-03-03 | Discharge: 2024-03-03 | Disposition: A | Discharge status: Home / Self Care | Source: Ambulatory Visit | Attending: Family Medicine | Admitting: Family Medicine

## 2024-03-03 DIAGNOSIS — Z1231 Encounter for screening mammogram for malignant neoplasm of breast: Secondary | ICD-10-CM

## 2024-03-24 DIAGNOSIS — I872 Venous insufficiency (chronic) (peripheral): Secondary | ICD-10-CM | POA: Diagnosis not present

## 2024-03-24 DIAGNOSIS — R600 Localized edema: Secondary | ICD-10-CM | POA: Diagnosis not present

## 2024-03-24 DIAGNOSIS — I10 Essential (primary) hypertension: Secondary | ICD-10-CM | POA: Diagnosis not present

## 2024-03-24 DIAGNOSIS — E1169 Type 2 diabetes mellitus with other specified complication: Secondary | ICD-10-CM | POA: Diagnosis not present

## 2024-03-25 ENCOUNTER — Other Ambulatory Visit (HOSPITAL_COMMUNITY): Payer: Self-pay | Admitting: Family Medicine

## 2024-03-25 DIAGNOSIS — I872 Venous insufficiency (chronic) (peripheral): Secondary | ICD-10-CM

## 2024-03-25 DIAGNOSIS — R600 Localized edema: Secondary | ICD-10-CM

## 2024-03-27 ENCOUNTER — Ambulatory Visit (HOSPITAL_COMMUNITY): Admission: RE | Admit: 2024-03-27 | Source: Ambulatory Visit

## 2024-03-31 ENCOUNTER — Other Ambulatory Visit: Payer: Self-pay | Admitting: Family Medicine

## 2024-03-31 ENCOUNTER — Ambulatory Visit (HOSPITAL_COMMUNITY)
Admission: RE | Admit: 2024-03-31 | Discharge: 2024-03-31 | Disposition: A | Discharge status: Home / Self Care | Source: Ambulatory Visit | Attending: Family Medicine | Admitting: Family Medicine

## 2024-03-31 DIAGNOSIS — R600 Localized edema: Secondary | ICD-10-CM | POA: Insufficient documentation

## 2024-03-31 DIAGNOSIS — I82491 Acute embolism and thrombosis of other specified deep vein of right lower extremity: Secondary | ICD-10-CM

## 2024-03-31 DIAGNOSIS — I872 Venous insufficiency (chronic) (peripheral): Secondary | ICD-10-CM | POA: Insufficient documentation

## 2024-03-31 DIAGNOSIS — I82431 Acute embolism and thrombosis of right popliteal vein: Secondary | ICD-10-CM | POA: Diagnosis not present

## 2024-03-31 DIAGNOSIS — I8290 Acute embolism and thrombosis of unspecified vein: Secondary | ICD-10-CM | POA: Diagnosis not present

## 2024-04-02 ENCOUNTER — Other Ambulatory Visit: Payer: Self-pay | Admitting: Family Medicine

## 2024-04-02 ENCOUNTER — Other Ambulatory Visit (HOSPITAL_COMMUNITY): Payer: Self-pay | Admitting: Family Medicine

## 2024-04-02 DIAGNOSIS — Z86718 Personal history of other venous thrombosis and embolism: Secondary | ICD-10-CM

## 2024-04-02 DIAGNOSIS — R2242 Localized swelling, mass and lump, left lower limb: Secondary | ICD-10-CM

## 2024-04-07 ENCOUNTER — Ambulatory Visit (HOSPITAL_COMMUNITY)
Admission: RE | Admit: 2024-04-07 | Discharge: 2024-04-07 | Disposition: A | Discharge status: Home / Self Care | Source: Ambulatory Visit | Attending: Family Medicine | Admitting: Family Medicine

## 2024-04-07 DIAGNOSIS — R2242 Localized swelling, mass and lump, left lower limb: Secondary | ICD-10-CM | POA: Diagnosis not present

## 2024-04-07 DIAGNOSIS — I82402 Acute embolism and thrombosis of unspecified deep veins of left lower extremity: Secondary | ICD-10-CM | POA: Diagnosis not present

## 2024-04-07 DIAGNOSIS — R600 Localized edema: Secondary | ICD-10-CM | POA: Diagnosis not present

## 2024-04-09 ENCOUNTER — Ambulatory Visit
Admission: RE | Admit: 2024-04-09 | Discharge: 2024-04-09 | Disposition: A | Discharge status: Home / Self Care | Source: Ambulatory Visit | Attending: Family Medicine | Admitting: Family Medicine

## 2024-04-09 DIAGNOSIS — J9811 Atelectasis: Secondary | ICD-10-CM | POA: Diagnosis not present

## 2024-04-09 DIAGNOSIS — Z86718 Personal history of other venous thrombosis and embolism: Secondary | ICD-10-CM

## 2024-04-09 MED ORDER — IOPAMIDOL (ISOVUE-370) INJECTION 76%
80.0000 mL | Freq: Once | INTRAVENOUS | Status: AC | PRN
  Administered 2024-04-09: 80 mL via INTRAVENOUS

## 2024-04-11 ENCOUNTER — Encounter: Payer: Self-pay | Admitting: Radiology

## 2024-04-15 DIAGNOSIS — R911 Solitary pulmonary nodule: Secondary | ICD-10-CM | POA: Diagnosis not present

## 2024-04-15 DIAGNOSIS — I82451 Acute embolism and thrombosis of right peroneal vein: Secondary | ICD-10-CM | POA: Diagnosis not present

## 2024-04-15 DIAGNOSIS — N1831 Chronic kidney disease, stage 3a: Secondary | ICD-10-CM | POA: Diagnosis not present

## 2024-04-15 DIAGNOSIS — I129 Hypertensive chronic kidney disease with stage 1 through stage 4 chronic kidney disease, or unspecified chronic kidney disease: Secondary | ICD-10-CM | POA: Diagnosis not present

## 2024-04-15 DIAGNOSIS — I82431 Acute embolism and thrombosis of right popliteal vein: Secondary | ICD-10-CM | POA: Diagnosis not present

## 2024-04-15 DIAGNOSIS — I8290 Acute embolism and thrombosis of unspecified vein: Secondary | ICD-10-CM | POA: Diagnosis not present

## 2024-04-15 DIAGNOSIS — I10 Essential (primary) hypertension: Secondary | ICD-10-CM | POA: Diagnosis not present

## 2024-04-15 DIAGNOSIS — E1169 Type 2 diabetes mellitus with other specified complication: Secondary | ICD-10-CM | POA: Diagnosis not present

## 2024-04-15 DIAGNOSIS — E1122 Type 2 diabetes mellitus with diabetic chronic kidney disease: Secondary | ICD-10-CM | POA: Diagnosis not present

## 2024-04-24 ENCOUNTER — Telehealth: Payer: Self-pay

## 2024-04-24 NOTE — Progress Notes (Signed)
   04/24/2024  Patient ID: [name], female   DOB: 08/06/1951, 73 y.o.   MRN: [id number]  Contacted patient regarding referral for medication access from Leigh Lung, MD .   Left patient a voicemail to return my call at their convenience  Heather Factor, PharmD Clinical Pharmacist  [phone]

## 2024-04-24 NOTE — Progress Notes (Signed)
   04/24/2024  Patient ID: [name], female   DOB: Feb 19, 1951, 73 y.o.   MRN: [id number]  Contacted patient regarding referral for medication access from Leigh Lung, MD .   Patient called back, I was able to screen her for medication assistance for Jardiance and Eliquis. The patient exceeds the income limit for both programs. She reports that the copays are manageable at this time. I educated her on the Medicare Advantage benefit structure, and what her copays may look like through the rest of the year.   She reports no other medication concerns at this time.   Heather Factor, PharmD Clinical Pharmacist  [phone]

## 2024-04-30 ENCOUNTER — Other Ambulatory Visit: Payer: Self-pay | Admitting: Adult Health

## 2024-04-30 DIAGNOSIS — D0511 Intraductal carcinoma in situ of right breast: Secondary | ICD-10-CM

## 2024-05-15 ENCOUNTER — Inpatient Hospital Stay: Attending: Hematology and Oncology | Admitting: Hematology and Oncology

## 2024-05-15 VITALS — BP 143/62 | HR 87 | Temp 97.2°F | Resp 17 | Wt 219.6 lb

## 2024-05-15 DIAGNOSIS — Z923 Personal history of irradiation: Secondary | ICD-10-CM | POA: Insufficient documentation

## 2024-05-15 DIAGNOSIS — D0511 Intraductal carcinoma in situ of right breast: Secondary | ICD-10-CM | POA: Insufficient documentation

## 2024-05-15 DIAGNOSIS — Z170 Estrogen receptor positive status [ER+]: Secondary | ICD-10-CM | POA: Diagnosis not present

## 2024-05-15 NOTE — Assessment & Plan Note (Signed)
 03/18/2019: Screening mammogram detected indeterminate groups of calcifications 1.5 cm and 0.8 cm biopsy initially showed LCIS.   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

## 2024-05-15 NOTE — Progress Notes (Signed)
 Patient Care Team: Leigh Lung, MD as PCP - General (Family Medicine) Odean Potts, MD as Consulting Physician (Hematology and Oncology)  DIAGNOSIS:  Encounter Diagnosis  Name Primary?   Ductal carcinoma in situ (DCIS) of right breast Yes    SUMMARY OF ONCOLOGIC HISTORY: Oncology History  Ductal carcinoma in situ (DCIS) of right breast  02/19/2019 Initial Diagnosis   Routine screening mammogram detected two indeterminate groups of calcifications in the upper right breast measuring 15mm and 8mm. Biopsy ([id number]) showed LCIS with calcifications.    03/18/2019 Surgery   Right lumpectomy Warden) [phone]): DCIS, intermediate grade, focally involving a small intraductal papilloma, clear margins, no invasive carcinoma, ER+ 100%, PR+ 50%.    03/18/2019 Cancer Staging   Staging form: Breast, AJCC 8th Edition - Clinical stage from 03/18/2019: Stage 0 (cTis (DCIS), cN0, cM0, ER+, PR+) - Signed by Crawford Morna Pickle, NP on 08/13/2019   03/18/2019 Cancer Staging   Staging form: Breast, AJCC 8th Edition - Pathologic stage from 03/18/2019: Stage 0 (pTis (DCIS), pN0, cM0, ER+, PR+) - Signed by Crawford Morna Pickle, NP on 08/13/2019   04/30/2019 - 05/27/2019 Radiation Therapy   Adjuvant radiation therapy The whole right breast was treated to 40.05 Gy in 15 fractions. This was followed by a boost to the seroma of 10 Gy in 5 fractions, for a total dose of 50.04 Gy in 20 fractions.   05/2019 -  Anti-estrogen oral therapy   Tamoxifen  20 mg     CHIEF COMPLIANT: Surveillance of breast cancer with history of DCIS  HISTORY OF PRESENT ILLNESS:   History of Present Illness [name] is a 73 year old female who presents for follow-up after completing a five-year course of medication.  She has completed the medication course without significant side effects, such as hot flashes, and is stopping the medication as of tonight. A mammogram in July showed no abnormalities, with  breast tissue classified as B.     ALLERGIES:  has no known allergies.  MEDICATIONS:  Current Outpatient Medications  Medication Sig Dispense Refill   acetaminophen  (TYLENOL ) 500 MG tablet Take 500 mg by mouth every 6 (six) hours as needed for mild pain.     aspirin 81 MG tablet Take 81 mg by mouth daily.     calcium  carbonate (CALTRATE 600) 1500 (600 Ca) MG TABS tablet Take 1 tablet (1,500 mg total) by mouth daily with breakfast.  0   Cholecalciferol (VITAMIN D ) 50 MCG (2000 UT) CAPS Take 1 capsule (2,000 Units total) by mouth daily. 30 capsule    Lancets (ONETOUCH DELICA PLUS LANCET33G) MISC Apply topically as directed.     metFORMIN  (GLUCOPHAGE ) 500 MG tablet Take 1 tablet (500 mg total) by mouth daily with breakfast.     ONETOUCH ULTRA test strip USE AS DIRECTED DAILY FOR TESTING     pravastatin (PRAVACHOL) 40 MG tablet Take 40 mg by mouth daily.     spironolactone-hydrochlorothiazide (ALDACTAZIDE) 25-25 MG per tablet Take 1 tablet by mouth daily.      tamoxifen  (NOLVADEX ) 20 MG tablet TAKE 1 TABLET BY MOUTH DAILY 90 tablet 2   No current facility-administered medications for this visit.    PHYSICAL EXAMINATION: ECOG PERFORMANCE STATUS: 1 - Symptomatic but completely ambulatory  Vitals:   05/15/24 1012  BP: (!) 143/62  Pulse: 87  Resp: 17  Temp: (!) 97.2 F (36.2 C)  SpO2: 99%   Filed Weights   05/15/24 1012  Weight: 219 lb 9.6 oz (99.6 kg)  Physical Exam   (exam performed in the presence of a chaperone)  LABORATORY DATA:  I have reviewed the data as listed    Latest Ref Rng & Units 09/11/2023    1:55 PM 05/31/2022    8:45 PM 10/28/2020    1:56 PM  CMP  Glucose 70 - 99 mg/dL 85  827    BUN 8 - 23 mg/dL 16  22    Creatinine 9.55 - 1.00 mg/dL 8.89  8.72    Sodium 864 - 145 mmol/L 140  135    Potassium 3.5 - 5.1 mmol/L 3.6  3.3    Chloride 98 - 111 mmol/L 102  97    CO2 22 - 32 mmol/L 29  27    Calcium  8.9 - 10.3 mg/dL 9.4  9.7    Total Protein 6.5 - 8.1  g/dL  8.1  7.3   Total Bilirubin 0.3 - 1.2 mg/dL  0.5  <9.7   Alkaline Phos 38 - 126 U/L  60  61   AST 15 - 41 U/L  15  18   ALT 0 - 44 U/L  15  15     Lab Results  Component Value Date   WBC 13.4 (H) 05/31/2022   HGB 12.2 05/31/2022   HCT 37.4 05/31/2022   MCV 88.4 05/31/2022   PLT 170 05/31/2022   NEUTROABS 12.3 (H) 05/31/2022    ASSESSMENT & PLAN:  Ductal carcinoma in situ (DCIS) of right breast 03/18/2019: Screening mammogram detected indeterminate groups of calcifications 1.5 cm and 0.8 cm biopsy initially showed LCIS.   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  Return to clinic on an as-needed basis    No orders of the defined types were placed in this encounter.  The patient has a good understanding of the overall plan. she agrees with it. she will call with any problems that may develop before the next visit here. Total time spent: 30 mins including face to face time and time spent for planning, charting and co-ordination of care   Viinay K Taher Vannote, MD 05/15/24

## 2024-06-09 ENCOUNTER — Encounter: Payer: Self-pay | Admitting: Podiatry

## 2024-06-09 ENCOUNTER — Ambulatory Visit: Admitting: Podiatry

## 2024-06-09 VITALS — Ht 64.5 in | Wt 219.6 lb

## 2024-06-09 DIAGNOSIS — M79675 Pain in left toe(s): Secondary | ICD-10-CM | POA: Diagnosis not present

## 2024-06-09 DIAGNOSIS — M79674 Pain in right toe(s): Secondary | ICD-10-CM

## 2024-06-09 DIAGNOSIS — B351 Tinea unguium: Secondary | ICD-10-CM | POA: Diagnosis not present

## 2024-06-09 DIAGNOSIS — L600 Ingrowing nail: Secondary | ICD-10-CM | POA: Diagnosis not present

## 2024-06-09 NOTE — Progress Notes (Signed)
   Chief Complaint  Patient presents with   Nail Problem    Pt is here for [facility], complains of bilateral great toenails, states she had them removed, states it feels like they are both growing back and hurts.    HPI: 73 y.o. female presenting today for above complaint  Past Medical History:  Diagnosis Date   Diabetes mellitus without complication (HCC)    Hyperlipidemia    Hypertension    Lobular carcinoma in situ (LCIS) of right breast 03/18/2019   Obesity    Personal history of radiation therapy    Stasis dermatitis    Venous insufficiency     Past Surgical History:  Procedure Laterality Date   ABDOMINAL HYSTERECTOMY     BREAST LUMPECTOMY Right 03/18/2019   BREAST LUMPECTOMY WITH RADIOACTIVE SEED LOCALIZATION Right 03/18/2019   Procedure: RIGHT BREAST LUMPECTOMY X 2  WITH RIGHT RADIOACTIVE SEED LOCALIZATION X 2;  Surgeon: Gail Favorite, MD;  Location: [facility];  Service: General;  Laterality: Right;   CHOLECYSTECTOMY     COLONOSCOPY N/A 07/02/2013   Procedure: COLONOSCOPY;  Surgeon: Margo LITTIE Haddock, MD;  [facility]: [facility];  Service: Endoscopy;  Laterality: N/A;  9:15 AM   COLONOSCOPY WITH PROPOFOL  N/A 09/18/2023   Procedure: COLONOSCOPY WITH PROPOFOL ;  Surgeon: Cindie Carlin POUR, DO;  Location: [facility];  Service: Endoscopy;  Laterality: N/A;  10:15am, asa 2   POLYPECTOMY  09/18/2023   Procedure: POLYPECTOMY INTESTINAL;  Surgeon: Cindie Carlin POUR, DO;  Location: [facility];  Service: Endoscopy;;    No Known Allergies   Physical Exam: General: The patient is alert and oriented x3 in no acute distress.  Dermatology: Skin is warm, dry and supple bilateral lower extremities.  Hyperkeratotic dystrophic nails noted 1-5 bilateral.  The majority of the bilateral great toenails are absent with exception of a recurrent nail spicule to the medial border of the right great toe with associated tenderness  Vascular: Palpable pedal pulses bilaterally.  Capillary refill within normal limits.  No appreciable edema.  No erythema.  Neurological: Grossly intact via light touch  Musculoskeletal Exam: No pedal deformities noted   Assessment/Plan of Care: 1.  Recurrent nail spicule medial border of the right great toe 2.  History of total permanent nail avulsion bilateral 3.  Pain due to onychomycosis of toenails both  -Patient evaluated -Mechanical debridement of nails 1-5 bilateral was performed using a nail nipper without incident or bleeding -The nail spicule to the medial border of the right great toe was avulsed.  Patient was able to tolerate this well without anesthesia.  After avulsion of the nail spicule 3 x 30-second application of phenol followed by alcohol flush was administered to see if we can permanently prevent the nail spicule from recurring.  Dressings applied.  Post care instructions provided -Return to clinic 4 weeks  *Leaving on a cruise in December          Thresa EMERSON Sar, DPM [facility]  Dr. Thresa EMERSON Sar, DPM    [address] [place], [place] [address]                Office [phone]  Fax [phone]

## 2024-06-10 DIAGNOSIS — N390 Urinary tract infection, site not specified: Secondary | ICD-10-CM | POA: Diagnosis not present

## 2024-06-10 DIAGNOSIS — L304 Erythema intertrigo: Secondary | ICD-10-CM | POA: Diagnosis not present

## 2024-06-10 DIAGNOSIS — I82431 Acute embolism and thrombosis of right popliteal vein: Secondary | ICD-10-CM | POA: Diagnosis not present

## 2024-06-10 DIAGNOSIS — N1832 Chronic kidney disease, stage 3b: Secondary | ICD-10-CM | POA: Diagnosis not present

## 2024-06-10 DIAGNOSIS — E1122 Type 2 diabetes mellitus with diabetic chronic kidney disease: Secondary | ICD-10-CM | POA: Diagnosis not present

## 2024-06-23 ENCOUNTER — Encounter: Payer: Self-pay | Admitting: Radiology

## 2024-07-03 ENCOUNTER — Encounter: Payer: Self-pay | Admitting: *Deleted

## 2024-07-07 ENCOUNTER — Ambulatory Visit: Admitting: Podiatry

## 2024-07-07 ENCOUNTER — Encounter: Payer: Self-pay | Admitting: Podiatry

## 2024-07-07 VITALS — Ht 64.5 in | Wt 219.6 lb

## 2024-07-07 DIAGNOSIS — B351 Tinea unguium: Secondary | ICD-10-CM

## 2024-07-07 DIAGNOSIS — M79674 Pain in right toe(s): Secondary | ICD-10-CM | POA: Diagnosis not present

## 2024-07-07 DIAGNOSIS — M79675 Pain in left toe(s): Secondary | ICD-10-CM

## 2024-07-07 NOTE — Progress Notes (Signed)
   Chief Complaint  Patient presents with   Nail Problem    Pt is here to f/u on bilateral toenails, states that it still feels like the nail is growing and is cutting skin.    HPI: 73 y.o. female presenting today for above complaint  Past Medical History:  Diagnosis Date   Diabetes mellitus without complication (HCC)    Hyperlipidemia    Hypertension    Lobular carcinoma in situ (LCIS) of right breast 03/18/2019   Obesity    Personal history of radiation therapy    Stasis dermatitis    Venous insufficiency     Past Surgical History:  Procedure Laterality Date   ABDOMINAL HYSTERECTOMY     BREAST LUMPECTOMY Right 03/18/2019   BREAST LUMPECTOMY WITH RADIOACTIVE SEED LOCALIZATION Right 03/18/2019   Procedure: RIGHT BREAST LUMPECTOMY X 2  WITH RIGHT RADIOACTIVE SEED LOCALIZATION X 2;  Surgeon: Gail Favorite, MD;  Location: [facility];  Service: General;  Laterality: Right;   CHOLECYSTECTOMY     COLONOSCOPY N/A 07/02/2013   Procedure: COLONOSCOPY;  Surgeon: Margo LITTIE Haddock, MD;  Location: [facility];  Service: Endoscopy;  Laterality: N/A;  9:15 AM   COLONOSCOPY WITH PROPOFOL  N/A 09/18/2023   Procedure: COLONOSCOPY WITH PROPOFOL ;  Surgeon: Cindie Carlin POUR, DO;  [facility]: [facility];  Service: Endoscopy;  Laterality: N/A;  10:15am, asa 2   POLYPECTOMY  09/18/2023   Procedure: POLYPECTOMY INTESTINAL;  Surgeon: Cindie Carlin POUR, DO;  [facility]: [facility];  Service: Endoscopy;;    No Known Allergies   Physical Exam: General: The patient is alert and oriented x3 in no acute distress.  Dermatology: Skin is warm, dry and supple bilateral lower extremities.  Hyperkeratotic dystrophic nails noted 1-5 bilateral.  The nail spicule area that was treated last visit appears to be resolved  Vascular: Palpable pedal pulses bilaterally. Capillary refill within normal limits.  No appreciable edema.  No erythema.  Neurological: Grossly intact via light  touch  Musculoskeletal Exam: No pedal deformities noted   Assessment/Plan of Care: 1.  Recurrent nail spicule medial border of the right great toe 2.  History of total permanent nail avulsion bilateral 3.  Pain due to onychomycosis of toenails both  -Patient evaluated -Mechanical debridement of nails 1-5 bilateral was performed using a nail nipper without incident or bleeding - Continue good supportive shoe gear -Return to clinic 3 months routine footcare  *Leaving on a cruise in December    Thresa EMERSON Sar, DPM [facility]  Dr. Thresa EMERSON Sar, DPM    [address] [place], [place] [address]                Office [phone]  Fax [phone]

## 2024-09-05 ENCOUNTER — Other Ambulatory Visit (HOSPITAL_COMMUNITY): Payer: Self-pay | Admitting: Nephrology

## 2024-09-05 DIAGNOSIS — N1832 Chronic kidney disease, stage 3b: Secondary | ICD-10-CM

## 2024-09-05 DIAGNOSIS — I129 Hypertensive chronic kidney disease with stage 1 through stage 4 chronic kidney disease, or unspecified chronic kidney disease: Secondary | ICD-10-CM

## 2024-10-06 ENCOUNTER — Ambulatory Visit: Admitting: Podiatry

## 2024-11-21 ENCOUNTER — Other Ambulatory Visit (HOSPITAL_COMMUNITY)
